# Patient Record
Sex: Female | Born: 1965 | Race: White | Hispanic: No | Marital: Married | Smoking: Never smoker
Health system: Southern US, Community
[De-identification: ages and names within clinical notes are randomized; demographics above are authoritative.]

## PROBLEM LIST (undated history)

## (undated) DIAGNOSIS — T4145XA Adverse effect of unspecified anesthetic, initial encounter: Secondary | ICD-10-CM

## (undated) DIAGNOSIS — T8859XA Other complications of anesthesia, initial encounter: Secondary | ICD-10-CM

## (undated) DIAGNOSIS — F419 Anxiety disorder, unspecified: Secondary | ICD-10-CM

## (undated) HISTORY — PX: LAPAROSCOPIC OVARIAN CYSTECTOMY: SUR786

## (undated) HISTORY — DX: Adverse effect of unspecified anesthetic, initial encounter: T41.45XA

## (undated) HISTORY — DX: Anxiety disorder, unspecified: F41.9

## (undated) HISTORY — PX: URETHRA SURGERY: SHX824

## (undated) HISTORY — DX: Other complications of anesthesia, initial encounter: T88.59XA

---

## 2000-06-04 ENCOUNTER — Inpatient Hospital Stay (HOSPITAL_COMMUNITY): Admission: AD | Admit: 2000-06-04 | Discharge: 2000-06-05 | Payer: Self-pay | Admitting: Obstetrics and Gynecology

## 2000-08-08 ENCOUNTER — Inpatient Hospital Stay (HOSPITAL_COMMUNITY): Admission: AD | Admit: 2000-08-08 | Discharge: 2000-08-08 | Payer: Self-pay | Admitting: Obstetrics & Gynecology

## 2000-08-11 ENCOUNTER — Inpatient Hospital Stay (HOSPITAL_COMMUNITY): Admission: AD | Admit: 2000-08-11 | Discharge: 2000-08-13 | Payer: Self-pay | Admitting: Obstetrics and Gynecology

## 2000-10-12 ENCOUNTER — Emergency Department (HOSPITAL_COMMUNITY): Admission: EM | Admit: 2000-10-12 | Discharge: 2000-10-12 | Payer: Self-pay | Admitting: Emergency Medicine

## 2003-03-18 ENCOUNTER — Other Ambulatory Visit: Admission: RE | Admit: 2003-03-18 | Discharge: 2003-03-18 | Payer: Self-pay | Admitting: Obstetrics and Gynecology

## 2008-07-11 ENCOUNTER — Emergency Department (HOSPITAL_COMMUNITY): Admission: EM | Admit: 2008-07-11 | Discharge: 2008-07-11 | Payer: Self-pay | Admitting: Emergency Medicine

## 2010-11-18 NOTE — H&P (Signed)
[place]  Patient:    Darlene Webb, Darlene Webb                     MRN: [id number] Adm. Date:  [id number] Attending:  Lenoard Aden CC:         [facility]   History and Physical  CHIEF COMPLAINT:  Advanced dilatation at 39 weeks for induction.  HISTORY OF PRESENT ILLNESS:  The patient is a 45 year old white female G2 P70, [facility] August 19, 2000, at 39 weeks who presents with advanced dilatation at 4+ cm, for induction.  The patient is GBS positive requiring intrapartum prophylaxis.  PAST MEDICAL HISTORY: 1. Remarkable for an uncomplicated vaginal delivery 7 pound 14 ounce female in    1998 with an epidural. 2. History of a right ovarian cyst removal in 1995.  No other medical or surgical hospitalizations.  FAMILY HISTORY:  Noncontributory.  ALLERGIES:  No known drug allergies.  MEDICATIONS:  Prenatal vitamins.  LABORATORY DATA:  Includes a blood type of A positive, Rh antibody negative, VDRL nonreactive with ______.  Hepatitis B surface antigen negative.  HIV nonreactive.  PHYSICAL EXAMINATION:  GENERAL:  The patient is well-developed, well-nourished white female in no acute distress.  HEENT:  Normal.  LUNGS:  Clear.  HEART:  Regular rhythm.  ABDOMEN:  Soft, gravid, nontender.  ESTIMATED FETAL WEIGHT:  7-1/2 pounds.  CERVICAL EXAM:  Reveals the cervix to be 4 cm dilated and about 70% effaced, soft, vertex and 0 station.  EXTREMITIES:  Revealed no ______.  NEUROLOGIC:  Nonfocal.  IMPRESSION: 1. A 39 week intrauterine pregnancy. 2. Advanced dilatation. 3. GBS positive.  PLAN:  Proceed with artificial rupture of membranes, intrapartum prophylaxis, Pitocin augmentation as necessary.  Anticipate attempts at vaginal delivery. DD:  08/11/00 TD:  08/11/00 Job: [id number]

## 2011-08-23 ENCOUNTER — Other Ambulatory Visit: Payer: Self-pay | Admitting: Obstetrics and Gynecology

## 2011-08-23 DIAGNOSIS — Z1231 Encounter for screening mammogram for malignant neoplasm of breast: Secondary | ICD-10-CM

## 2011-09-06 ENCOUNTER — Ambulatory Visit
Admission: RE | Admit: 2011-09-06 | Discharge: 2011-09-06 | Disposition: A | Discharge status: Home / Self Care | Payer: BC Managed Care – PPO | Source: Ambulatory Visit | Attending: Obstetrics and Gynecology | Admitting: Obstetrics and Gynecology

## 2011-09-06 DIAGNOSIS — Z1231 Encounter for screening mammogram for malignant neoplasm of breast: Secondary | ICD-10-CM

## 2015-10-20 ENCOUNTER — Encounter: Payer: Self-pay | Admitting: Gastroenterology

## 2015-12-07 ENCOUNTER — Ambulatory Visit (AMBULATORY_SURGERY_CENTER): Payer: BLUE CROSS/BLUE SHIELD

## 2015-12-07 VITALS — Ht 63.0 in | Wt 141.6 lb

## 2015-12-07 DIAGNOSIS — Z800 Family history of malignant neoplasm of digestive organs: Secondary | ICD-10-CM

## 2015-12-07 DIAGNOSIS — Z8371 Family history of colonic polyps: Secondary | ICD-10-CM

## 2015-12-07 MED ORDER — NA SULFATE-K SULFATE-MG SULF 17.5-3.13-1.6 GM/177ML PO SOLN: ORAL | Status: DC

## 2015-12-07 NOTE — Progress Notes (Signed)
Per pt, no allergies to soy or egg products.Pt not taking any weight loss meds or using  O2 at home. 

## 2015-12-09 ENCOUNTER — Encounter: Payer: Self-pay | Admitting: Gastroenterology

## 2015-12-21 ENCOUNTER — Encounter: Payer: Self-pay | Admitting: Gastroenterology

## 2015-12-21 ENCOUNTER — Ambulatory Visit (AMBULATORY_SURGERY_CENTER): Payer: BLUE CROSS/BLUE SHIELD | Admitting: Gastroenterology

## 2015-12-21 VITALS — BP 130/92 | HR 69 | Temp 98.0°F | Resp 19 | Ht 63.0 in | Wt 141.0 lb

## 2015-12-21 DIAGNOSIS — Z1211 Encounter for screening for malignant neoplasm of colon: Secondary | ICD-10-CM | POA: Diagnosis present

## 2015-12-21 DIAGNOSIS — D123 Benign neoplasm of transverse colon: Secondary | ICD-10-CM | POA: Diagnosis not present

## 2015-12-21 DIAGNOSIS — Z8371 Family history of colonic polyps: Secondary | ICD-10-CM

## 2015-12-21 DIAGNOSIS — D120 Benign neoplasm of cecum: Secondary | ICD-10-CM

## 2015-12-21 DIAGNOSIS — Z83719 Family history of colon polyps, unspecified: Secondary | ICD-10-CM

## 2015-12-21 MED ORDER — SODIUM CHLORIDE 0.9 % IV SOLN: 500.0000 mL | INTRAVENOUS | Status: DC

## 2015-12-21 NOTE — Op Note (Addendum)
[place] Patient Name: Darlene Webb Procedure Date: 12/21/2015 8:57 AM MRN: [id number] Endoscopist: Remo Lipps P. Havery Moros , MD Age: 50 Referring MD:  Date of Birth: 1965-09-24 Gender: Female Account #: [id number] Procedure:                Colonoscopy Indications:              Screening for malignant neoplasm in the colon, This                            is the patient's first colonoscopy Medicines:                Monitored Anesthesia Care Procedure:                Pre-Anesthesia Assessment:                           - Prior to the procedure, a History and Physical                            was performed, and patient medications and                            allergies were reviewed. The patient's tolerance of                            previous anesthesia was also reviewed. The risks                            and benefits of the procedure and the sedation                            options and risks were discussed with the patient.                            All questions were answered, and informed consent                            was obtained. Prior Anticoagulants: The patient has                            taken no previous anticoagulant or antiplatelet                            agents. ASA Grade Assessment: II - A patient with                            mild systemic disease. After reviewing the risks                            and benefits, the patient was deemed in                            satisfactory condition to undergo the procedure.  After obtaining informed consent, the colonoscope                            was passed under direct vision. Throughout the                            procedure, the patient's blood pressure, pulse, and                            oxygen saturations were monitored continuously. The                            Model PCF-H190L [id number]) scope was introduced                            through the anus  and advanced to the the cecum,                            identified by appendiceal orifice and ileocecal                            valve. The colonoscopy was performed without                            difficulty. The patient tolerated the procedure                            well. The quality of the bowel preparation was                            good. The ileocecal valve, appendiceal orifice, and                            rectum were photographed. Scope In: 9:11:22 AM Scope Out: 9:31:13 AM Scope Withdrawal Time: 0 hours 16 minutes 44 seconds  Total Procedure Duration: 0 hours 19 minutes 51 seconds  Findings:                 The perianal and digital rectal examinations were                            normal.                           A 3 mm polyp was found in the cecum. The polyp was                            sessile. The polyp was removed with a cold biopsy                            forceps. Resection and retrieval were complete.                           A 5 mm polyp was found in the cecum. The polyp was  sessile. The polyp was removed with a cold snare.                            Resection and retrieval were complete.                           A 3 mm polyp was found in the transverse colon. The                            polyp was sessile. The polyp was removed with a                            cold biopsy forceps. Resection and retrieval were                            complete.                           Non-bleeding internal hemorrhoids were found during                            retroflexion. The hemorrhoids were small.                           The exam was otherwise without abnormality. Complications:            No immediate complications. Estimated blood loss:                            Minimal. Estimated Blood Loss:     Estimated blood loss was minimal. Impression:               - One 3 mm polyp in the cecum, removed with a cold                             biopsy forceps. Resected and retrieved.                           - One 5 mm polyp in the cecum, removed with a cold                            snare. Resected and retrieved.                           - One 3 mm polyp in the transverse colon, removed                            with a cold biopsy forceps. Resected and retrieved.                           - Non-bleeding internal hemorrhoids.                           - The examination was otherwise normal. Recommendation:           -  Patient has a contact number available for                            emergencies. The signs and symptoms of potential                            delayed complications were discussed with the                            patient. Return to normal activities tomorrow.                            Written discharge instructions were provided to the                            patient.                           - Resume previous diet.                           - Continue present medications.                           - Await pathology results.                           - Repeat colonoscopy is recommended for                            surveillance. The colonoscopy date will be                            determined after pathology results from today's                            exam become available for review. Remo Lipps P. Guenther Dunshee, MD 12/21/2015 9:35:06 AM This report has been signed electronically.

## 2015-12-21 NOTE — Progress Notes (Signed)
No problems noted in the recovery room. maw 

## 2015-12-21 NOTE — Progress Notes (Signed)
Patient awakening,vss,report to rn 

## 2015-12-21 NOTE — Progress Notes (Signed)
Called to room to assist during endoscopic procedure.  Patient ID and intended procedure confirmed with present staff. Received instructions for my participation in the procedure from the performing physician.  

## 2015-12-21 NOTE — Patient Instructions (Signed)
YOU HAD AN ENDOSCOPIC PROCEDURE TODAY AT THE [facility]:   Refer to the procedure report that was given to you for any specific questions about what was found during the examination.  If the procedure report does not answer your questions, please call your gastroenterologist to clarify.  If you requested that your care partner not be given the details of your procedure findings, then the procedure report has been included in a sealed envelope for you to review at your convenience later.  YOU SHOULD EXPECT: Some feelings of bloating in the abdomen. Passage of more gas than usual.  Walking can help get rid of the air that was put into your GI tract during the procedure and reduce the bloating. If you had a lower endoscopy (such as a colonoscopy or flexible sigmoidoscopy) you may notice spotting of blood in your stool or on the toilet paper. If you underwent a bowel prep for your procedure, you may not have a normal bowel movement for a few days.  Please Note:  You might notice some irritation and congestion in your nose or some drainage.  This is from the oxygen used during your procedure.  There is no need for concern and it should clear up in a day or so.  SYMPTOMS TO REPORT IMMEDIATELY:   Following lower endoscopy (colonoscopy or flexible sigmoidoscopy):  Excessive amounts of blood in the stool  Significant tenderness or worsening of abdominal pains  Swelling of the abdomen that is new, acute  Fever of 100F or higher   For urgent or emergent issues, a gastroenterologist can be reached at any hour by calling [phone].   DIET: Your first meal following the procedure should be a small meal and then it is ok to progress to your normal diet. Heavy or fried foods are harder to digest and may make you feel nauseous or bloated.  Likewise, meals heavy in dairy and vegetables can increase bloating.  Drink plenty of fluids but you should avoid alcoholic beverages for 24  hours.  ACTIVITY:  You should plan to take it easy for the rest of today and you should NOT DRIVE or use heavy machinery until tomorrow (because of the sedation medicines used during the test).    FOLLOW UP: Our staff will call the number listed on your records the next business day following your procedure to check on you and address any questions or concerns that you may have regarding the information given to you following your procedure. If we do not reach you, we will leave a message.  However, if you are feeling well and you are not experiencing any problems, there is no need to return our call.  We will assume that you have returned to your regular daily activities without incident.  If any biopsies were taken you will be contacted by phone or by letter within the next 1-3 weeks.  Please call us at [phone] if you have not heard about the biopsies in 3 weeks.    SIGNATURES/CONFIDENTIALITY: You and/or your care partner have signed paperwork which will be entered into your electronic medical record.  These signatures attest to the fact that that the information above on your After Visit Summary has been reviewed and is understood.  Full responsibility of the confidentiality of this discharge information lies with you and/or your care-partner.   Handouts were given to your care partner on polyps and hemorrhoids. You may resume your current medications today. Await biopsy results. Please call   any questions or concerns.   

## 2015-12-22 ENCOUNTER — Telehealth: Payer: Self-pay

## 2015-12-22 NOTE — Telephone Encounter (Signed)
  Follow up Call-  Call back number 12/21/2015  Post procedure Call Back phone  # [phone]  Permission to leave phone message Yes     Patient was called for follow up after her procedure on 12/21/2015. No answer at the number given for follow up phone call. A message was left on the answering machine.

## 2015-12-23 ENCOUNTER — Encounter: Payer: Self-pay | Admitting: Gastroenterology

## 2016-01-31 ENCOUNTER — Telehealth: Payer: Self-pay | Admitting: Gastroenterology

## 2016-01-31 NOTE — Telephone Encounter (Signed)
Patient states she noticed a raised lump on labia several days after procedure. Wants to know if anything is mention in her colonoscopy report about this. No note in procedure report. Patient aware and she is going to call her GYN to evaluated issue.

## 2018-12-19 ENCOUNTER — Encounter: Payer: Self-pay | Admitting: Gastroenterology

## 2019-08-28 ENCOUNTER — Other Ambulatory Visit: Payer: Self-pay

## 2019-08-28 ENCOUNTER — Ambulatory Visit (AMBULATORY_SURGERY_CENTER): Payer: Self-pay | Admitting: *Deleted

## 2019-08-28 VITALS — Temp 98.0°F | Ht 63.0 in | Wt 122.2 lb

## 2019-08-28 DIAGNOSIS — Z01818 Encounter for other preprocedural examination: Secondary | ICD-10-CM

## 2019-08-28 DIAGNOSIS — Z86010 Personal history of colonic polyps: Secondary | ICD-10-CM

## 2019-08-28 MED ORDER — NA SULFATE-K SULFATE-MG SULF 17.5-3.13-1.6 GM/177ML PO SOLN: 1.0000 | Freq: Once | ORAL | 0 refills | Status: AC

## 2019-08-28 NOTE — Progress Notes (Signed)

## 2019-09-02 ENCOUNTER — Telehealth: Payer: Self-pay | Admitting: Gastroenterology

## 2019-09-02 DIAGNOSIS — Z86010 Personal history of colonic polyps: Secondary | ICD-10-CM

## 2019-09-02 NOTE — Telephone Encounter (Signed)
Patient called inquiring about her prep medication would like it sent to [facility] in [place]

## 2019-09-03 MED ORDER — NA SULFATE-K SULFATE-MG SULF 17.5-3.13-1.6 GM/177ML PO SOLN: 1.0000 | Freq: Once | ORAL | 0 refills | Status: AC

## 2019-09-03 NOTE — Telephone Encounter (Signed)
Suprep sent to [facility] on [address]. EW

## 2019-09-05 MED ORDER — SUPREP BOWEL PREP KIT 17.5-3.13-1.6 GM/177ML PO SOLN: 1.0000 | Freq: Once | ORAL | 0 refills | Status: AC

## 2019-09-05 NOTE — Addendum Note (Signed)
Addended by: Steva Ready on: 09/05/2019 11:36 AM   Modules accepted: Orders

## 2019-09-05 NOTE — Telephone Encounter (Signed)
Sent in script for Suprep to [facility] - the drug is now showing in med list- pt made aware of prep sent

## 2019-09-05 NOTE — Telephone Encounter (Signed)
Pt stated that [facility] on [address] did not receive prescription for Suprep.

## 2019-09-11 ENCOUNTER — Other Ambulatory Visit: Payer: Self-pay | Admitting: Gastroenterology

## 2019-09-11 ENCOUNTER — Ambulatory Visit (INDEPENDENT_AMBULATORY_CARE_PROVIDER_SITE_OTHER): Payer: Self-pay

## 2019-09-11 DIAGNOSIS — Z86010 Personal history of colonic polyps: Secondary | ICD-10-CM

## 2019-09-11 LAB — SARS CORONAVIRUS 2 (TAT 6-24 HRS): SARS Coronavirus 2: NEGATIVE

## 2019-09-16 ENCOUNTER — Ambulatory Visit (AMBULATORY_SURGERY_CENTER): Payer: No Typology Code available for payment source | Admitting: Gastroenterology

## 2019-09-16 ENCOUNTER — Encounter: Payer: Self-pay | Admitting: Gastroenterology

## 2019-09-16 ENCOUNTER — Other Ambulatory Visit: Payer: Self-pay

## 2019-09-16 VITALS — BP 134/66 | HR 71 | Temp 96.9°F | Resp 16 | Ht 63.0 in | Wt 122.0 lb

## 2019-09-16 DIAGNOSIS — D125 Benign neoplasm of sigmoid colon: Secondary | ICD-10-CM | POA: Diagnosis not present

## 2019-09-16 DIAGNOSIS — D122 Benign neoplasm of ascending colon: Secondary | ICD-10-CM

## 2019-09-16 DIAGNOSIS — D120 Benign neoplasm of cecum: Secondary | ICD-10-CM | POA: Diagnosis not present

## 2019-09-16 DIAGNOSIS — D123 Benign neoplasm of transverse colon: Secondary | ICD-10-CM

## 2019-09-16 DIAGNOSIS — Z86010 Personal history of colonic polyps: Secondary | ICD-10-CM | POA: Diagnosis not present

## 2019-09-16 MED ORDER — SODIUM CHLORIDE 0.9 % IV SOLN: 500.0000 mL | Freq: Once | INTRAVENOUS | Status: DC

## 2019-09-16 NOTE — Progress Notes (Signed)
Pt's states no medical or surgical changes since previsit or office visit.  [place]

## 2019-09-16 NOTE — Progress Notes (Signed)
pt tolerated well. VSS. awake and to recovery. Report given to RN.  

## 2019-09-16 NOTE — Progress Notes (Signed)
Called to room to assist during endoscopic procedure.  Patient ID and intended procedure confirmed with present staff. Received instructions for my participation in the procedure from the performing physician.  

## 2019-09-16 NOTE — Op Note (Signed)
[place] Patient Name: Darlene Webb Procedure Date: 09/16/2019 1:28 PM MRN: [id number] Endoscopist: Remo Lipps P. Havery Moros , MD Age: 54 Referring MD:  Date of Birth: [birth year] Gender: Female Account #: [id number] Procedure:                Colonoscopy Indications:              High risk colon cancer surveillance: Personal                            history of colonic polyps (3 adenomas / sessile                            serrated polyps 2017) Medicines:                Monitored Anesthesia Care Procedure:                Pre-Anesthesia Assessment:                           - Prior to the procedure, a History and Physical                            was performed, and patient medications and                            allergies were reviewed. The patient's tolerance of                            previous anesthesia was also reviewed. The risks                            and benefits of the procedure and the sedation                            options and risks were discussed with the patient.                            All questions were answered, and informed consent                            was obtained. Prior Anticoagulants: The patient has                            taken no previous anticoagulant or antiplatelet                            agents. ASA Grade Assessment: I - A normal, healthy                            patient. After reviewing the risks and benefits,                            the patient was deemed in satisfactory condition to  undergo the procedure.                           After obtaining informed consent, the colonoscope                            was passed under direct vision. Throughout the                            procedure, the patient's blood pressure, pulse, and                            oxygen saturations were monitored continuously. The                            Colonoscope was introduced through the anus and                             advanced to the the cecum, identified by                            appendiceal orifice and ileocecal valve. The                            colonoscopy was performed without difficulty. The                            patient tolerated the procedure well. The quality                            of the bowel preparation was good. The ileocecal                            valve, appendiceal orifice, and rectum were                            photographed. Scope In: 1:32:09 PM Scope Out: 1:57:27 PM Scope Withdrawal Time: 0 hours 20 minutes 37 seconds  Total Procedure Duration: 0 hours 25 minutes 18 seconds  Findings:                 The perianal and digital rectal examinations were                            normal.                           A diminutive polyp was found in the cecum. The                            polyp was sessile. The polyp was removed with a                            cold snare. Resection and retrieval were complete.  Two sessile polyps were found in the ascending                            colon. The polyps were 3 mm in size. These polyps                            were removed with a cold snare. Resection and                            retrieval were complete.                           Two sessile polyps were found in the transverse                            colon. The polyps were 3 to 4 mm in size. These                            polyps were removed with a cold snare. Resection                            and retrieval were complete.                           A 3 mm polyp was found in the sigmoid colon. The                            polyp was sessile. The polyp was removed with a                            cold snare. Resection and retrieval were complete.                           The exam was otherwise without abnormality. Complications:            No immediate complications. Estimated blood loss:                             Minimal. Estimated Blood Loss:     Estimated blood loss was minimal. Impression:               - One diminutive polyp in the cecum, removed with a                            cold snare. Resected and retrieved.                           - Two 3 mm polyps in the ascending colon, removed                            with a cold snare. Resected and retrieved.                           -  Two 3 to 4 mm polyps in the transverse colon,                            removed with a cold snare. Resected and retrieved.                           - One 3 mm polyp in the sigmoid colon, removed with                            a cold snare. Resected and retrieved.                           - The examination was otherwise normal. Recommendation:           - Patient has a contact number available for                            emergencies. The signs and symptoms of potential                            delayed complications were discussed with the                            patient. Return to normal activities tomorrow.                            Written discharge instructions were provided to the                            patient.                           - Resume previous diet.                           - Continue present medications.                           - Await pathology results. Remo Lipps P. Kashish Yglesias, MD 09/16/2019 2:03:33 PM This report has been signed electronically.

## 2019-09-16 NOTE — Patient Instructions (Signed)
Hand outs given: Polyps X6 Resume previous diet Continue current medications Await pathology results   YOU HAD AN ENDOSCOPIC PROCEDURE TODAY AT [place]:   Refer to the procedure report that was given to you for any specific questions about what was found during the examination.  If the procedure report does not answer your questions, please call your gastroenterologist to clarify.  If you requested that your care partner not be given the details of your procedure findings, then the procedure report has been included in a sealed envelope for you to review at your convenience later.  YOU SHOULD EXPECT: Some feelings of bloating in the abdomen. Passage of more gas than usual.  Walking can help get rid of the air that was put into your GI tract during the procedure and reduce the bloating. If you had a lower endoscopy (such as a colonoscopy or flexible sigmoidoscopy) you may notice spotting of blood in your stool or on the toilet paper. If you underwent a bowel prep for your procedure, you may not have a normal bowel movement for a few days.  Please Note:  You might notice some irritation and congestion in your nose or some drainage.  This is from the oxygen used during your procedure.  There is no need for concern and it should clear up in a day or so.  SYMPTOMS TO REPORT IMMEDIATELY:   Following lower endoscopy (colonoscopy or flexible sigmoidoscopy):  Excessive amounts of blood in the stool  Significant tenderness or worsening of abdominal pains  Swelling of the abdomen that is new, acute  Fever of 100F or higher   For urgent or emergent issues, a gastroenterologist can be reached at any hour by calling [phone]. Do not use MyChart messaging for urgent concerns.    DIET:  We do recommend a small meal at first, but then you may proceed to your regular diet.  Drink plenty of fluids but you should avoid alcoholic beverages for 24 hours.  ACTIVITY:  You should plan  to take it easy for the rest of today and you should NOT DRIVE or use heavy machinery until tomorrow (because of the sedation medicines used during the test).    FOLLOW UP: Our staff will call the number listed on your records 48-72 hours following your procedure to check on you and address any questions or concerns that you may have regarding the information given to you following your procedure. If we do not reach you, we will leave a message.  We will attempt to reach you two times.  During this call, we will ask if you have developed any symptoms of COVID 19. If you develop any symptoms (ie: fever, flu-like symptoms, shortness of breath, cough etc.) before then, please call [phone].  If you test positive for Covid 19 in the 2 weeks post procedure, please call and report this information to [place].    If any biopsies were taken you will be contacted by phone or by letter within the next 1-3 weeks.  Please call us at [phone] if you have not heard about the biopsies in 3 weeks.    SIGNATURES/CONFIDENTIALITY: You and/or your care partner have signed paperwork which will be entered into your electronic medical record.  These signatures attest to the fact that that the information above on your After Visit Summary has been reviewed and is understood.  Full responsibility of the confidentiality of this discharge information lies with you and/or your care-partner.

## 2019-09-18 ENCOUNTER — Telehealth: Payer: Self-pay

## 2019-09-18 ENCOUNTER — Telehealth: Payer: Self-pay | Admitting: *Deleted

## 2019-09-18 NOTE — Telephone Encounter (Signed)
  Follow up Call-  Call back number 09/16/2019  Post procedure Call Back phone  # [phone]  Permission to leave phone message Yes  Some recent data might be hidden     Patient questions:  Do you have a fever, pain , or abdominal swelling? No. Pain Score  0 *  Have you tolerated food without any problems? Yes.    Have you been able to return to your normal activities? Yes.    Do you have any questions about your discharge instructions: Diet   No. Medications  No. Follow up visit  No.  Do you have questions or concerns about your Care? No.  Actions: * If pain score is 4 or above: No action needed, pain <4.  1. Have you developed a fever since your procedure? no  2.   Have you had an respiratory symptoms (SOB or cough) since your procedure? no  3.   Have you tested positive for COVID 19 since your procedure no  4.   Have you had any family members/close contacts diagnosed with the COVID 19 since your procedure?  no   If yes to any of these questions please route to Joylene John, RN and Alphonsa Gin, Therapist, sports.

## 2019-09-18 NOTE — Telephone Encounter (Signed)
  Follow up Call-  Call back number 09/16/2019  Post procedure Call Back phone  # [phone]  Permission to leave phone message Yes  Some recent data might be hidden     Patient questions:  Message left to all [place] if necessary.

## 2019-09-22 ENCOUNTER — Encounter: Payer: Self-pay | Admitting: Gastroenterology

## 2019-10-06 ENCOUNTER — Telehealth: Payer: Self-pay | Admitting: Gastroenterology

## 2019-10-06 NOTE — Telephone Encounter (Signed)
Thanks [employer]. She had a few polyps removed during recent colonoscopy, only 2 very small polyps were considered to be adenomas, the rest were benign without pre-cancerous changes. Her grandfather's history should not influence how frequently she gets screening at this point, only a first degree colon cancer history at an age < 8 would warrant screenings sooner (every 5 years).  Please reassure her that her risk for colon cancer is low based on her recent exam and current guidelines recommend a 7 year follow up. If she is anxious about this could consider it to be done in 5 years for piece of mind, up to her. Thanks

## 2019-10-06 NOTE — Telephone Encounter (Signed)
Patient questions the interval of her colonoscopy to 7 years.  She has a family hx of colon cancer in a grandfather dx at age 55.  Give the number or polyps she feels that it should be sooner than 7 years.  She did not want to schedule an office visit to discuss. She is aware you are the hospital MD and may not address until next week.

## 2019-10-07 ENCOUNTER — Other Ambulatory Visit: Payer: Self-pay | Admitting: Otolaryngology

## 2019-10-07 DIAGNOSIS — J339 Nasal polyp, unspecified: Secondary | ICD-10-CM

## 2019-10-07 NOTE — Telephone Encounter (Signed)
Left message for patient to call back  

## 2019-10-08 NOTE — Telephone Encounter (Signed)
Patient notified of the response 

## 2019-10-10 MED ORDER — NA SULFATE-K SULFATE-MG SULF 17.5-3.13-1.6 GM/177ML PO SOLN
1.0000 | Freq: Once | ORAL | 0 refills | Status: AC
Start: 2019-10-10 — End: 2019-10-10

## 2019-10-16 ENCOUNTER — Other Ambulatory Visit: Payer: Self-pay

## 2019-10-17 ENCOUNTER — Encounter: Payer: Self-pay | Admitting: Family Medicine

## 2019-10-17 ENCOUNTER — Ambulatory Visit (INDEPENDENT_AMBULATORY_CARE_PROVIDER_SITE_OTHER): Payer: No Typology Code available for payment source | Admitting: Family Medicine

## 2019-10-17 ENCOUNTER — Ambulatory Visit
Admission: RE | Admit: 2019-10-17 | Discharge: 2019-10-17 | Disposition: A | Discharge status: Home / Self Care | Payer: No Typology Code available for payment source | Source: Ambulatory Visit | Attending: Otolaryngology | Admitting: Otolaryngology

## 2019-10-17 VITALS — BP 120/80 | HR 81 | Temp 98.4°F | Ht 63.0 in | Wt 127.2 lb

## 2019-10-17 DIAGNOSIS — J339 Nasal polyp, unspecified: Secondary | ICD-10-CM

## 2019-10-17 DIAGNOSIS — K635 Polyp of colon: Secondary | ICD-10-CM | POA: Insufficient documentation

## 2019-10-17 DIAGNOSIS — R5383 Other fatigue: Secondary | ICD-10-CM

## 2019-10-17 DIAGNOSIS — Z1231 Encounter for screening mammogram for malignant neoplasm of breast: Secondary | ICD-10-CM

## 2019-10-17 DIAGNOSIS — Z131 Encounter for screening for diabetes mellitus: Secondary | ICD-10-CM | POA: Diagnosis not present

## 2019-10-17 DIAGNOSIS — D126 Benign neoplasm of colon, unspecified: Secondary | ICD-10-CM | POA: Diagnosis not present

## 2019-10-17 DIAGNOSIS — Z13220 Encounter for screening for lipoid disorders: Secondary | ICD-10-CM

## 2019-10-17 NOTE — Progress Notes (Signed)
Darlene Webb DOB: 1965/10/15 Encounter date: 10/17/2019  This isa 54 y.o. female who presents to establish care. Chief Complaint  Patient presents with  . Establish Care    History of present illness: No longer seeing Dr. Ronita Webb.   No specific worries today.   Took sertraline a few years ago when husband was working a lot.   Had colonoscopy first 3-4 years ago. Repeated last month and had polyps and was told 7 years.   Exercises at least three times/week for hour walk. More when weather good. Pretty healthy eater overall.   Doesn't follow with dermatology regularly.  Had CT this morning through ENT. Considering surgery for nasal polyp.   Past Medical History:  Diagnosis Date  . Anesthesia complication    sensitive to sedation per pt!   Past Surgical History:  Procedure Laterality Date  . LAPAROSCOPIC OVARIAN CYSTECTOMY    . URETHRA SURGERY     stretched as an infant   No Known Allergies No outpatient medications have been marked as taking for the 10/17/19 encounter (Office Visit) with Caren Macadam, MD.   Social History   Tobacco Use  . Smoking status: Never Smoker  . Smokeless tobacco: Never Used  Substance Use Topics  . Alcohol use: Yes    Alcohol/week: 7.0 standard drinks    Types: 7 Cans of beer per week   Family History  Problem Relation Age of Onset  . Colon polyps Mother   . Parkinson's disease Father 46  . Healthy Sister   . Colon cancer Maternal Grandfather   . Colon cancer Maternal Great-grandfather   . Lung cancer Maternal Grandmother        smoker  . Multiple myeloma Paternal Grandfather   . Alopecia Son   . Eczema Daughter   . Esophageal cancer Neg Hx   . Rectal cancer Neg Hx   . Stomach cancer Neg Hx      Review of Systems  Constitutional: Positive for fatigue. Negative for chills and fever.  Respiratory: Negative for cough, chest tightness, shortness of breath and wheezing.   Cardiovascular: Negative for chest pain,  palpitations and leg swelling.    Objective:  BP 120/80 (BP Location: Left Arm, Patient Position: Sitting, Cuff Size: Normal)   Pulse 81   Temp 98.4 F (36.9 C) (Temporal)   Ht 5' 3"  (1.6 m)   Wt 127 lb 3.2 oz (57.7 kg)   LMP 11/19/2015   BMI 22.53 kg/m   Weight: 127 lb 3.2 oz (57.7 kg)   BP Readings from Last 3 Encounters:  10/17/19 120/80  09/16/19 134/66  12/21/15 (!) 130/92   Wt Readings from Last 3 Encounters:  10/17/19 127 lb 3.2 oz (57.7 kg)  09/16/19 122 lb (55.3 kg)  08/28/19 122 lb 3.2 oz (55.4 kg)    Physical Exam Constitutional:      General: She is not in acute distress.    Appearance: She is well-developed.  Cardiovascular:     Rate and Rhythm: Normal rate and regular rhythm.     Heart sounds: Normal heart sounds. No murmur. No friction rub.  Pulmonary:     Effort: Pulmonary effort is normal. No respiratory distress.     Breath sounds: Normal breath sounds. No wheezing or rales.  Musculoskeletal:     Right lower leg: No edema.     Left lower leg: No edema.  Neurological:     Mental Status: She is alert and oriented to person, place, and time.  Psychiatric:  Behavior: Behavior normal.     Assessment/Plan:  1. Adenomatous polyp of colon, unspecified part of colon We discussed reasons for suggested GI follow-up, but patient would feel more comfortable with 5-year follow-up.  We discussed that guidelines can also change over time, so we will continue to discuss and as time approaches for 5-year mark we can review what return screening options are recommended at that time.  2. Fatigue, unspecified type - VITAMIN D 25 Hydroxy (Vit-D Deficiency, Fractures); Future - Vitamin B12; Future - TSH; Future - Comprehensive metabolic panel; Future - CBC with Differential/Platelet; Future - CBC with Differential/Platelet - Comprehensive metabolic panel - TSH - Vitamin B12 - VITAMIN D 25 Hydroxy (Vit-D Deficiency, Fractures)  3. Lipid screening -  Lipid panel; Future - Lipid panel  4. Screening for diabetes mellitus  5. Encounter for screening mammogram for malignant neoplasm of breast - MM DIGITAL SCREENING BILATERAL; Future  Return in about 3 months (around 01/16/2020) for physical exam.  [name], MD

## 2019-10-18 LAB — COMPREHENSIVE METABOLIC PANEL
AG Ratio: 1.6 (calc) (ref 1.0–2.5)
ALT: 15 U/L (ref 6–29)
AST: 14 U/L (ref 10–35)
Albumin: 4.3 g/dL (ref 3.6–5.1)
Alkaline phosphatase (APISO): 68 U/L (ref 37–153)
BUN: 11 mg/dL (ref 7–25)
CO2: 27 mmol/L (ref 20–32)
Calcium: 9.3 mg/dL (ref 8.6–10.4)
Chloride: 106 mmol/L (ref 98–110)
Creat: 0.71 mg/dL (ref 0.50–1.05)
Globulin: 2.7 g/dL (calc) (ref 1.9–3.7)
Glucose, Bld: 95 mg/dL (ref 65–99)
Potassium: 4.3 mmol/L (ref 3.5–5.3)
Sodium: 141 mmol/L (ref 135–146)
Total Bilirubin: 0.7 mg/dL (ref 0.2–1.2)
Total Protein: 7 g/dL (ref 6.1–8.1)

## 2019-10-18 LAB — CBC WITH DIFFERENTIAL/PLATELET
Absolute Monocytes: 360 cells/uL (ref 200–950)
Basophils Absolute: 50 cells/uL (ref 0–200)
Basophils Relative: 0.7 %
Eosinophils Absolute: 353 cells/uL (ref 15–500)
Eosinophils Relative: 4.9 %
HCT: 35.8 % (ref 35.0–45.0)
Hemoglobin: 11.7 g/dL (ref 11.7–15.5)
Lymphs Abs: 2945 cells/uL (ref 850–3900)
MCH: 30 pg (ref 27.0–33.0)
MCHC: 32.7 g/dL (ref 32.0–36.0)
MCV: 91.8 fL (ref 80.0–100.0)
MPV: 8.9 fL (ref 7.5–12.5)
Monocytes Relative: 5 %
Neutro Abs: 3492 cells/uL (ref 1500–7800)
Neutrophils Relative %: 48.5 %
Platelets: 456 10*3/uL — ABNORMAL HIGH (ref 140–400)
RBC: 3.9 10*6/uL (ref 3.80–5.10)
RDW: 11.9 % (ref 11.0–15.0)
Total Lymphocyte: 40.9 %
WBC: 7.2 10*3/uL (ref 3.8–10.8)

## 2019-10-18 LAB — VITAMIN B12: Vitamin B-12: 280 pg/mL (ref 200–1100)

## 2019-10-18 LAB — VITAMIN D 25 HYDROXY (VIT D DEFICIENCY, FRACTURES): Vit D, 25-Hydroxy: 9 ng/mL — ABNORMAL LOW (ref 30–100)

## 2019-10-18 LAB — LIPID PANEL
Cholesterol: 215 mg/dL — ABNORMAL HIGH (ref <200)
HDL: 84 mg/dL (ref >=50)
LDL Cholesterol (Calc): 115 mg/dL (calc) — ABNORMAL HIGH
Non-HDL Cholesterol (Calc): 131 mg/dL (calc) — ABNORMAL HIGH (ref <130)
Total CHOL/HDL Ratio: 2.6 (calc) (ref <5.0)
Triglycerides: 67 mg/dL (ref <150)

## 2019-10-18 LAB — TSH: TSH: 1.16 mIU/L

## 2019-10-27 ENCOUNTER — Other Ambulatory Visit: Payer: Self-pay | Admitting: Otolaryngology

## 2019-10-30 MED ORDER — VITAMIN D (ERGOCALCIFEROL) 1.25 MG (50000 UNIT) PO CAPS
50000.0000 [IU] | ORAL_CAPSULE | ORAL | 0 refills | Status: DC
Start: 2019-10-30 — End: 2019-12-30

## 2019-10-30 NOTE — Addendum Note (Signed)
Addended by: Agnes Lawrence on: 10/30/2019 09:48 AM   Modules accepted: Orders

## 2019-11-18 ENCOUNTER — Encounter (HOSPITAL_BASED_OUTPATIENT_CLINIC_OR_DEPARTMENT_OTHER): Payer: Self-pay

## 2019-11-18 ENCOUNTER — Ambulatory Visit (HOSPITAL_BASED_OUTPATIENT_CLINIC_OR_DEPARTMENT_OTHER): Admit: 2019-11-18 | Payer: No Typology Code available for payment source | Admitting: Otolaryngology

## 2019-11-18 SURGERY — SURGERY, PARANASAL SINUS, ENDOSCOPIC, WITH NASAL SEPTOPLASTY, TURBINOPLASTY, AND MAXILLARY SINUSOTOMY
Anesthesia: General | Laterality: Bilateral

## 2019-11-21 ENCOUNTER — Ambulatory Visit
Admission: RE | Admit: 2019-11-21 | Discharge: 2019-11-21 | Disposition: A | Discharge status: Home / Self Care | Payer: PRIVATE HEALTH INSURANCE | Source: Ambulatory Visit | Attending: Family Medicine | Admitting: Family Medicine

## 2019-11-21 ENCOUNTER — Other Ambulatory Visit: Payer: Self-pay

## 2019-11-21 DIAGNOSIS — Z1231 Encounter for screening mammogram for malignant neoplasm of breast: Secondary | ICD-10-CM

## 2019-12-30 ENCOUNTER — Other Ambulatory Visit: Payer: Self-pay | Admitting: Family Medicine

## 2020-01-16 ENCOUNTER — Encounter: Payer: No Typology Code available for payment source | Admitting: Family Medicine

## 2020-03-01 ENCOUNTER — Other Ambulatory Visit: Payer: Self-pay

## 2020-03-01 ENCOUNTER — Ambulatory Visit (INDEPENDENT_AMBULATORY_CARE_PROVIDER_SITE_OTHER): Payer: PRIVATE HEALTH INSURANCE | Admitting: Family Medicine

## 2020-03-01 ENCOUNTER — Encounter: Payer: Self-pay | Admitting: Family Medicine

## 2020-03-01 ENCOUNTER — Other Ambulatory Visit (HOSPITAL_COMMUNITY)
Admission: RE | Admit: 2020-03-01 | Discharge: 2020-03-01 | Disposition: A | Discharge status: Home / Self Care | Payer: PRIVATE HEALTH INSURANCE | Source: Ambulatory Visit | Attending: Family Medicine | Admitting: Family Medicine

## 2020-03-01 VITALS — BP 138/80 | HR 82 | Temp 98.7°F | Ht 62.75 in | Wt 118.8 lb

## 2020-03-01 DIAGNOSIS — Z0000 Encounter for general adult medical examination without abnormal findings: Secondary | ICD-10-CM

## 2020-03-01 DIAGNOSIS — Z23 Encounter for immunization: Secondary | ICD-10-CM

## 2020-03-01 DIAGNOSIS — Z124 Encounter for screening for malignant neoplasm of cervix: Secondary | ICD-10-CM | POA: Diagnosis not present

## 2020-03-01 NOTE — Patient Instructions (Addendum)
(  insight timer is a free app - go to guided meditations on there and get some assistance with that deep breathing/calming prior to your meeting today!)  Check your blood pressure at home a few times in next week. Let me know if elevated.

## 2020-03-01 NOTE — Progress Notes (Signed)
Darlene Webb DOB: 05-09-1966 Encounter date: 03/01/2020  This is a 54 y.o. female who presents for complete physical   History of present illness/Additional concerns: Last visit was for/21 for establish care. Last colonoscopy March/2021.  Repeat 5 years based on family history.  Has afternoon meeting and she is stressed about this as she has a feeling she will be ambushed. Has been a zoom/COVID miscommunication year.   Past Medical History:  Diagnosis Date  . Anesthesia complication    sensitive to sedation per pt!   Past Surgical History:  Procedure Laterality Date  . LAPAROSCOPIC OVARIAN CYSTECTOMY    . URETHRA SURGERY     stretched as an infant   No Known Allergies Current Meds  Medication Sig  . Cyanocobalamin (VITAMIN B-12 PO) Take by mouth.  . Vitamin D, Ergocalciferol, (DRISDOL) 1.25 MG (50000 UNIT) CAPS capsule TAKE 1 CAPSULE (50,000 UNITS TOTAL) BY MOUTH EVERY 7 (SEVEN) DAYS.   Social History   Tobacco Use  . Smoking status: Never Smoker  . Smokeless tobacco: Never Used  Substance Use Topics  . Alcohol use: Yes    Alcohol/week: 7.0 standard drinks    Types: 7 Cans of beer per week   Family History  Problem Relation Age of Onset  . Colon polyps Mother   . Parkinson's disease Father 19  . Healthy Sister   . Colon cancer Maternal Grandfather   . Colon cancer Maternal Great-grandfather   . Lung cancer Maternal Grandmother        smoker  . Multiple myeloma Paternal Grandfather   . Alopecia Son   . Eczema Daughter   . Esophageal cancer Neg Hx   . Rectal cancer Neg Hx   . Stomach cancer Neg Hx      Review of Systems  Constitutional: Negative for activity change, appetite change, chills, fatigue, fever and unexpected weight change.  HENT: Negative for congestion, ear pain, hearing loss, sinus pressure, sinus pain, sore throat and trouble swallowing.   Eyes: Negative for pain and visual disturbance.  Respiratory: Negative for cough, chest tightness,  shortness of breath and wheezing.   Cardiovascular: Negative for chest pain, palpitations and leg swelling.  Gastrointestinal: Negative for abdominal pain, blood in stool, constipation, diarrhea, nausea and vomiting.  Genitourinary: Negative for difficulty urinating and menstrual problem.  Musculoskeletal: Negative for arthralgias and back pain.  Skin: Negative for rash.  Neurological: Negative for dizziness, weakness, numbness and headaches.  Hematological: Negative for adenopathy. Does not bruise/bleed easily.  Psychiatric/Behavioral: Negative for sleep disturbance and suicidal ideas. The patient is not nervous/anxious.     CBC:  Lab Results  Component Value Date   WBC 7.2 10/17/2019   HGB 11.7 10/17/2019   HCT 35.8 10/17/2019   MCH 30.0 10/17/2019   MCHC 32.7 10/17/2019   RDW 11.9 10/17/2019   PLT 456 (H) 10/17/2019   MPV 8.9 10/17/2019   CMP: Lab Results  Component Value Date   NA 141 10/17/2019   K 4.3 10/17/2019   CL 106 10/17/2019   CO2 27 10/17/2019   GLUCOSE 95 10/17/2019   BUN 11 10/17/2019   CREATININE 0.71 10/17/2019   CALCIUM 9.3 10/17/2019   PROT 7.0 10/17/2019   BILITOT 0.7 10/17/2019   ALT 15 10/17/2019   AST 14 10/17/2019   LIPID: Lab Results  Component Value Date   CHOL 215 (H) 10/17/2019   TRIG 67 10/17/2019   HDL 84 10/17/2019   LDLCALC 115 (H) 10/17/2019    Objective:  BP  138/80 (BP Location: Left Arm, Patient Position: Sitting, Cuff Size: Normal)   Pulse 82   Temp 98.7 F (37.1 C) (Oral)   Ht 5' 2.75" (1.594 m)   Wt 118 lb 12.8 oz (53.9 kg)   LMP 11/19/2015   BMI 21.21 kg/m   Weight: 118 lb 12.8 oz (53.9 kg)   BP Readings from Last 3 Encounters:  03/01/20 138/80  10/17/19 120/80  09/16/19 134/66   Wt Readings from Last 3 Encounters:  03/01/20 118 lb 12.8 oz (53.9 kg)  10/17/19 127 lb 3.2 oz (57.7 kg)  09/16/19 122 lb (55.3 kg)    Physical Exam Exam conducted with a chaperone present.  Constitutional:      General: She is  not in acute distress.    Appearance: She is well-developed.  HENT:     Head: Normocephalic and atraumatic.     Right Ear: External ear normal.     Left Ear: External ear normal.     Mouth/Throat:     Pharynx: No oropharyngeal exudate.  Eyes:     Conjunctiva/sclera: Conjunctivae normal.     Pupils: Pupils are equal, round, and reactive to light.  Neck:     Thyroid: No thyromegaly.  Cardiovascular:     Rate and Rhythm: Normal rate and regular rhythm.     Heart sounds: Normal heart sounds. No murmur heard.  No friction rub. No gallop.   Pulmonary:     Effort: Pulmonary effort is normal.     Breath sounds: Normal breath sounds.  Abdominal:     General: Bowel sounds are normal. There is no distension.     Palpations: Abdomen is soft. There is no mass.     Tenderness: There is no abdominal tenderness. There is no guarding.     Hernia: No hernia is present. There is no hernia in the left inguinal area or right inguinal area.  Genitourinary:    Exam position: Supine.     Vagina: Normal.     Cervix: Normal.     Uterus: Normal.      Adnexa: Right adnexa normal.  Musculoskeletal:        General: No tenderness or deformity. Normal range of motion.     Cervical back: Normal range of motion and neck supple.  Lymphadenopathy:     Cervical: No cervical adenopathy.     Lower Body: No right inguinal adenopathy. No left inguinal adenopathy.  Skin:    General: Skin is warm and dry.     Findings: No rash.  Neurological:     Mental Status: She is alert and oriented to person, place, and time.     Deep Tendon Reflexes: Reflexes normal.     Reflex Scores:      Tricep reflexes are 2+ on the right side and 2+ on the left side.      Bicep reflexes are 2+ on the right side and 2+ on the left side.      Brachioradialis reflexes are 2+ on the right side and 2+ on the left side.      Patellar reflexes are 2+ on the right side and 2+ on the left side. Psychiatric:        Speech: Speech normal.         Behavior: Behavior normal.        Thought Content: Thought content normal.     Assessment/Plan: Health Maintenance Due  Topic Date Due  . PAP SMEAR-Modifier  07/03/2016  . INFLUENZA VACCINE  02/01/2020  Health Maintenance reviewed - colonoscopy is up to date. Mammogram up to date. Weight loss secondary to home working project.  1. Preventative health care Keep up with regular walking. She is up to date with preventative health care today.  - Tdap vaccine greater than or equal to 7yo IM  I have asked her to monitor her blood pressure at home and let me know if pressures are running high at home.  She is under significant stress today, and blood pressure remain the same even on my recheck.    Return for return for nurse visit for second shingrix in 2-6 months.  [name], MD

## 2020-03-03 LAB — CYTOLOGY - PAP
Comment: NEGATIVE
Diagnosis: NEGATIVE
Diagnosis: REACTIVE
High risk HPV: NEGATIVE

## 2020-04-19 ENCOUNTER — Other Ambulatory Visit: Payer: Self-pay | Admitting: Family Medicine

## 2020-05-03 ENCOUNTER — Other Ambulatory Visit: Payer: Self-pay

## 2020-05-03 ENCOUNTER — Ambulatory Visit (INDEPENDENT_AMBULATORY_CARE_PROVIDER_SITE_OTHER): Payer: PRIVATE HEALTH INSURANCE | Admitting: *Deleted

## 2020-05-03 DIAGNOSIS — Z23 Encounter for immunization: Secondary | ICD-10-CM | POA: Diagnosis not present

## 2020-10-03 ENCOUNTER — Emergency Department
Admission: EM | Admit: 2020-10-03 | Discharge: 2020-10-03 | Disposition: A | Discharge status: Home / Self Care | Payer: PRIVATE HEALTH INSURANCE | Source: Home / Self Care

## 2020-10-03 ENCOUNTER — Telehealth: Payer: Self-pay | Admitting: Family Medicine

## 2020-10-03 ENCOUNTER — Encounter: Payer: Self-pay | Admitting: Emergency Medicine

## 2020-10-03 DIAGNOSIS — M795 Residual foreign body in soft tissue: Secondary | ICD-10-CM | POA: Diagnosis not present

## 2020-10-03 DIAGNOSIS — M79645 Pain in left finger(s): Secondary | ICD-10-CM

## 2020-10-03 DIAGNOSIS — L03114 Cellulitis of left upper limb: Secondary | ICD-10-CM

## 2020-10-03 MED ORDER — CEPHALEXIN 500 MG PO CAPS: 500.0000 mg | ORAL_CAPSULE | Freq: Two times a day (BID) | ORAL | 0 refills | Status: AC

## 2020-10-03 NOTE — Telephone Encounter (Signed)
Antibiotic did not go through to pharmacy.

## 2020-10-03 NOTE — ED Provider Notes (Signed)
[name]    CSN: [id number] Arrival date & time: 10/03/20  1016      History   Chief Complaint Chief Complaint  Patient presents with  . Foreign Body    HPI Darlene Webb is a 55 y.o. female.   Reports that she was trimming bushes at home about 2 weeks ago, and she thinks that there is a thorn stuck in her left thumb.  Reports that the area is raised, red, has been draining green fluid.  She reports the area is also very tender, she has not been able to extract any foreign body from the area has not attempted other OTC treatment.  Denies radiating pain, red streaking up the arm, fever, nausea, vomiting, diarrhea, rash, other symptoms.  ROS  The history is provided by the patient.    Past Medical History:  Diagnosis Date  . Anesthesia complication    sensitive to sedation per pt!    Patient Active Problem List   Diagnosis Date Noted  . Colon polyps 10/17/2019    Past Surgical History:  Procedure Laterality Date  . LAPAROSCOPIC OVARIAN CYSTECTOMY    . URETHRA SURGERY     stretched as an infant    OB History   No obstetric history on file.      Home Medications    Prior to Admission medications   Medication Sig Start Date End Date Taking? Authorizing Provider  CALCIUM PO Take by mouth.   Yes [provider]  Cyanocobalamin (VITAMIN B-12 PO) Take by mouth.   Yes [provider]  cephALEXin (KEFLEX) 500 MG capsule Take 1 capsule (500 mg total) by mouth 2 (two) times daily for 7 days. 10/03/20 10/10/20  Faustino Congress, NP  Vitamin D, Ergocalciferol, (DRISDOL) 1.25 MG (50000 UNIT) CAPS capsule TAKE 1 CAPSULE (50,000 UNITS TOTAL) BY MOUTH EVERY 7 (SEVEN) DAYS. 12/30/19   Caren Macadam, MD    Family History Family History  Problem Relation Age of Onset  . Colon polyps Mother   . Parkinson's disease Father 75  . Healthy Sister   . Colon cancer Maternal Grandfather   . Colon cancer Maternal Great-grandfather   .  Lung cancer Maternal Grandmother        smoker  . Multiple myeloma Paternal Grandfather   . Alopecia Son   . Eczema Daughter   . Esophageal cancer Neg Hx   . Rectal cancer Neg Hx   . Stomach cancer Neg Hx     Social History Social History   Tobacco Use  . Smoking status: Never Smoker  . Smokeless tobacco: Never Used  Vaping Use  . Vaping Use: Never used  Substance Use Topics  . Alcohol use: Yes    Alcohol/week: 7.0 standard drinks    Types: 7 Cans of beer per week  . Drug use: No     Allergies   Patient has no known allergies.   Review of Systems Review of Systems   Physical Exam Triage Vital Signs ED Triage Vitals [10/03/20 1027]  Enc Vitals Group     BP (!) 155/84     Pulse Rate 69     Resp      Temp 98.9 F (37.2 C)     Temp Source Oral     SpO2 98 %     Weight      Height      Head Circumference      Peak Flow      Pain Score 1  Pain Loc      Pain Edu?      Excl. in [place]?    No data found.  Updated Vital Signs BP (!) 155/84 (BP Location: Left Arm)   Pulse 69   Temp 98.9 F (37.2 C) (Oral)   LMP 11/19/2015   SpO2 98%   Visual Acuity Right Eye Distance:   Left Eye Distance:   Bilateral Distance:    Right Eye Near:   Left Eye Near:    Bilateral Near:     Physical Exam   UC Treatments / Results  Labs (all labs ordered are listed, but only abnormal results are displayed) Labs Reviewed - No data to display  EKG   Radiology No results found.  Procedures Foreign Body Removal  Date/Time: 10/07/2020 1:25 PM Performed by: Faustino Congress, NP Authorized by: Faustino Congress, NP   Consent:    Consent obtained:  Verbal   Consent given by:  Patient   Risks discussed:  Bleeding, infection and incomplete removal   Alternatives discussed:  Alternative treatment Universal protocol:    Patient identity confirmed:  Verbally with patient and arm band Location:    Location:  Finger   Finger location:  L thumb   Depth:   Subcutaneous   Tendon involvement:  None Pre-procedure details:    Imaging:  None Anesthesia:    Anesthesia method:  Topical application (Attempted local infiltration, patient could not tolerate)   Topical anesthetic:  LET Procedure type:    Procedure complexity:  Simple Procedure details:    Foreign bodies recovered:  None Post-procedure details:    Skin closure:  None   Procedure completion:  Procedure terminated at patient's request Comments:     Attempted to anesthetize the area with local infiltration of 1% lidocaine without epinephrine to the pad of the left thumb, patient requested to stop the procedure she cannot tolerate the anesthesia.  Applied let to the area, and this was not enough anesthesia to create an incision without an undue amount of pain.  Procedure stopped at patient's request.  Unable to retrieve foreign body from the left thumb.   (including critical care time)  Medications Ordered in UC Medications - No data to display  Initial Impression / Assessment and Plan / UC Course  I have reviewed the triage vital signs and the nursing notes.  Pertinent labs & imaging results that were available during my care of the patient were reviewed by me and considered in my medical decision making (see chart for details).    Left thumb pain Foreign body to left thumb  Attempted to remove foreign body from the left thumb Patient could not tolerate anesthesia procedure Procedure stopped of patient's request Was able to incise a bit of callus that had formed over the area to the pad of the left thumb Instructed patient to leave the area open And to use a paste of baking soda and water to the area to try to drop the foreign body Follow-up with primary care as needed Follow-up with the ER for high fever, red streaking up the hand and arm, nausea, vomiting, trouble breathing, trouble swallowing, other concerning symptoms  Final Clinical Impressions(s) / UC Diagnoses   Final  diagnoses:  Foreign body in soft tissue  Pain of left thumb   Discharge Instructions   None    ED Prescriptions    None     PDMP not reviewed this encounter.   Faustino Congress, NP 10/07/20 1330

## 2020-10-03 NOTE — ED Triage Notes (Signed)
Patient states that she was trimming bushes about 2 weeks ago, got a "thorn" in her left thumb.  Patient has tried to remove it but is unable.  Area is red with green drainage, some swelling.

## 2020-10-07 DIAGNOSIS — M795 Residual foreign body in soft tissue: Secondary | ICD-10-CM

## 2020-10-07 NOTE — Discharge Instructions (Addendum)
We are not able to retrieve the sword from the pad of your left thumb  We will cover for cellulitis given green drainage, erythema, tenderness to the area with Keflex twice a day for 7 days.  Follow-up with PCP as needed

## 2020-11-03 ENCOUNTER — Encounter: Payer: Self-pay | Admitting: Family Medicine

## 2021-09-09 ENCOUNTER — Ambulatory Visit (INDEPENDENT_AMBULATORY_CARE_PROVIDER_SITE_OTHER): Payer: No Typology Code available for payment source | Admitting: Family Medicine

## 2021-09-09 ENCOUNTER — Encounter: Payer: Self-pay | Admitting: Family Medicine

## 2021-09-09 VITALS — BP 116/80 | HR 75 | Temp 97.7°F | Ht 62.5 in | Wt 128.6 lb

## 2021-09-09 DIAGNOSIS — M67449 Ganglion, unspecified hand: Secondary | ICD-10-CM

## 2021-09-09 DIAGNOSIS — Z131 Encounter for screening for diabetes mellitus: Secondary | ICD-10-CM

## 2021-09-09 DIAGNOSIS — E559 Vitamin D deficiency, unspecified: Secondary | ICD-10-CM | POA: Diagnosis not present

## 2021-09-09 DIAGNOSIS — Z13220 Encounter for screening for lipoid disorders: Secondary | ICD-10-CM

## 2021-09-09 DIAGNOSIS — E538 Deficiency of other specified B group vitamins: Secondary | ICD-10-CM | POA: Diagnosis not present

## 2021-09-09 DIAGNOSIS — L720 Epidermal cyst: Secondary | ICD-10-CM

## 2021-09-09 DIAGNOSIS — Z0000 Encounter for general adult medical examination without abnormal findings: Secondary | ICD-10-CM

## 2021-09-09 DIAGNOSIS — Z1231 Encounter for screening mammogram for malignant neoplasm of breast: Secondary | ICD-10-CM

## 2021-09-09 LAB — CBC WITH DIFFERENTIAL/PLATELET
Basophils Absolute: 0.1 10*3/uL (ref 0.0–0.1)
Basophils Relative: 1 % (ref 0.0–3.0)
Eosinophils Absolute: 0.4 10*3/uL (ref 0.0–0.7)
Eosinophils Relative: 5.8 % — ABNORMAL HIGH (ref 0.0–5.0)
HCT: 37.4 % (ref 36.0–46.0)
Hemoglobin: 12.6 g/dL (ref 12.0–15.0)
Lymphocytes Relative: 30.2 % (ref 12.0–46.0)
Lymphs Abs: 2 10*3/uL (ref 0.7–4.0)
MCHC: 33.8 g/dL (ref 30.0–36.0)
MCV: 91.1 fl (ref 78.0–100.0)
Monocytes Absolute: 0.4 10*3/uL (ref 0.1–1.0)
Monocytes Relative: 5.6 % (ref 3.0–12.0)
Neutro Abs: 3.8 10*3/uL (ref 1.4–7.7)
Neutrophils Relative %: 57.4 % (ref 43.0–77.0)
Platelets: 338 10*3/uL (ref 150.0–400.0)
RBC: 4.11 Mil/uL (ref 3.87–5.11)
RDW: 13.3 % (ref 11.5–15.5)
WBC: 6.6 10*3/uL (ref 4.0–10.5)

## 2021-09-09 LAB — COMPREHENSIVE METABOLIC PANEL
ALT: 15 U/L (ref 0–35)
AST: 19 U/L (ref 0–37)
Albumin: 4.6 g/dL (ref 3.5–5.2)
Alkaline Phosphatase: 65 U/L (ref 39–117)
BUN: 11 mg/dL (ref 6–23)
CO2: 27 mEq/L (ref 19–32)
Calcium: 9.7 mg/dL (ref 8.4–10.5)
Chloride: 101 mEq/L (ref 96–112)
Creatinine, Ser: 0.73 mg/dL (ref 0.40–1.20)
GFR: 91.51 mL/min (ref >60.00)
Glucose, Bld: 91 mg/dL (ref 70–99)
Potassium: 3.7 mEq/L (ref 3.5–5.1)
Sodium: 138 mEq/L (ref 135–145)
Total Bilirubin: 0.9 mg/dL (ref 0.2–1.2)
Total Protein: 8 g/dL (ref 6.0–8.3)

## 2021-09-09 LAB — LIPID PANEL
Cholesterol: 210 mg/dL — ABNORMAL HIGH (ref 0–200)
HDL: 83.5 mg/dL (ref >39.00)
LDL Cholesterol: 110 mg/dL — ABNORMAL HIGH (ref 0–99)
NonHDL: 126.9
Total CHOL/HDL Ratio: 3
Triglycerides: 85 mg/dL (ref 0.0–149.0)
VLDL: 17 mg/dL (ref 0.0–40.0)

## 2021-09-09 LAB — VITAMIN D 25 HYDROXY (VIT D DEFICIENCY, FRACTURES): VITD: 30.22 ng/mL (ref 30.00–100.00)

## 2021-09-09 LAB — VITAMIN B12: Vitamin B-12: 968 pg/mL — ABNORMAL HIGH (ref 211–911)

## 2021-09-09 NOTE — Progress Notes (Signed)
Darlene Webb ?DOB: 10-23-1965 ?Encounter date: 09/09/2021 ? ?This is a 56 y.o. female who presents for complete physical  ? ?History of present illness/Additional concerns: ?Last visit with me was 03/01/2020 for complete physical. ? ?2 lumps one on finger, one on back. One on back she thinks was inflamed, and has drained. Had boil on back years ago that was surgically drained. Lump on ring finger. Not sore. Not getting bigger. Has been there over a year. ? ?Repeat colonoscopy due 08/2024 (family history of colon cancer) ?Pap completed at last visit; repeat due 01/2025 (normal pap, negative hpv). No vaginal concerns, issues.  ?Last mammogram: 11/24/19.  ? ?Taking B12, iron, calcium daily. Started iron after getting turned away giving blood.  ? ? ?Past Medical History:  ?Diagnosis Date  ? Anesthesia complication   ? sensitive to sedation per pt!  ? ?Past Surgical History:  ?Procedure Laterality Date  ? LAPAROSCOPIC OVARIAN CYSTECTOMY    ? URETHRA SURGERY    ? stretched as an infant  ? ?No Known Allergies ?Current Meds  ?Medication Sig  ? CALCIUM PO Take by mouth.  ? Cyanocobalamin (VITAMIN B-12 PO) Take by mouth.  ? Ferrous Sulfate (IRON PO) Take by mouth daily.  ? ?Social History  ? ?Tobacco Use  ? Smoking status: Never  ? Smokeless tobacco: Never  ?Substance Use Topics  ? Alcohol use: Yes  ?  Alcohol/week: 7.0 standard drinks  ?  Types: 7 Cans of beer per week  ? ?Family History  ?Problem Relation Age of Onset  ? Colon polyps Mother   ? Parkinson's disease Father 70  ? Healthy Sister   ? Colon cancer Maternal Grandfather   ? Colon cancer Maternal Great-grandfather   ? Lung cancer Maternal Grandmother   ?     smoker  ? Multiple myeloma Paternal Grandfather   ? Alopecia Son   ? Eczema Daughter   ? Esophageal cancer Neg Hx   ? Rectal cancer Neg Hx   ? Stomach cancer Neg Hx   ? ? ? ?Review of Systems  ?Constitutional:  Negative for activity change, appetite change, chills, fatigue, fever and unexpected weight change.   ?HENT:  Negative for congestion, ear pain, hearing loss, sinus pressure, sinus pain, sore throat and trouble swallowing.   ?Eyes:  Negative for pain and visual disturbance.  ?Respiratory:  Negative for cough, chest tightness, shortness of breath and wheezing.   ?Cardiovascular:  Negative for chest pain, palpitations and leg swelling.  ?Gastrointestinal:  Negative for abdominal pain, blood in stool, constipation, diarrhea, nausea and vomiting.  ?Genitourinary:  Negative for difficulty urinating and menstrual problem.  ?Musculoskeletal:  Negative for arthralgias and back pain.  ?Skin:  Negative for rash.  ?     See HPI.  ?Neurological:  Negative for dizziness, weakness, numbness and headaches.  ?Hematological:  Negative for adenopathy. Does not bruise/bleed easily.  ?Psychiatric/Behavioral:  Negative for sleep disturbance and suicidal ideas. The patient is not nervous/anxious.   ? ?CBC:  ?Lab Results  ?Component Value Date  ? WBC 7.2 10/17/2019  ? HGB 11.7 10/17/2019  ? HCT 35.8 10/17/2019  ? MCH 30.0 10/17/2019  ? MCHC 32.7 10/17/2019  ? RDW 11.9 10/17/2019  ? PLT 456 (H) 10/17/2019  ? MPV 8.9 10/17/2019  ? ?CMP: ?Lab Results  ?Component Value Date  ? NA 141 10/17/2019  ? K 4.3 10/17/2019  ? CL 106 10/17/2019  ? CO2 27 10/17/2019  ? GLUCOSE 95 10/17/2019  ? BUN 11 10/17/2019  ?  CREATININE 0.71 10/17/2019  ? CALCIUM 9.3 10/17/2019  ? PROT 7.0 10/17/2019  ? BILITOT 0.7 10/17/2019  ? ALT 15 10/17/2019  ? AST 14 10/17/2019  ? ?LIPID: ?Lab Results  ?Component Value Date  ? CHOL 215 (H) 10/17/2019  ? TRIG 67 10/17/2019  ? HDL 84 10/17/2019  ? LDLCALC 115 (H) 10/17/2019  ? ? ?Objective: ? ?BP 116/80 (BP Location: Left Arm, Patient Position: Sitting, Cuff Size: Normal)   Pulse 75   Temp 97.7 ?F (36.5 ?C) (Oral)   Ht 5' 2.5" (1.588 m)   Wt 128 lb 9.6 oz (58.3 kg)   LMP 11/19/2015   SpO2 98%   BMI 23.15 kg/m?   Weight: 128 lb 9.6 oz (58.3 kg)  ? ?BP Readings from Last 3 Encounters:  ?09/09/21 116/80  ?10/03/20 (!)  155/84  ?03/01/20 138/80  ? ?Wt Readings from Last 3 Encounters:  ?09/09/21 128 lb 9.6 oz (58.3 kg)  ?03/01/20 118 lb 12.8 oz (53.9 kg)  ?10/17/19 127 lb 3.2 oz (57.7 kg)  ? ? ?Physical Exam ?Constitutional:   ?   General: She is not in acute distress. ?   Appearance: She is well-developed.  ?HENT:  ?   Head: Normocephalic and atraumatic.  ?   Right Ear: External ear normal.  ?   Left Ear: External ear normal.  ?   Mouth/Throat:  ?   Pharynx: No oropharyngeal exudate.  ?Eyes:  ?   Conjunctiva/sclera: Conjunctivae normal.  ?   Pupils: Pupils are equal, round, and reactive to light.  ?Neck:  ?   Thyroid: No thyromegaly.  ?Cardiovascular:  ?   Rate and Rhythm: Normal rate and regular rhythm.  ?   Heart sounds: Normal heart sounds. No murmur heard. ?  No friction rub. No gallop.  ?Pulmonary:  ?   Effort: Pulmonary effort is normal.  ?   Breath sounds: Normal breath sounds.  ?Abdominal:  ?   General: Bowel sounds are normal. There is no distension.  ?   Palpations: Abdomen is soft. There is no mass.  ?   Tenderness: There is no abdominal tenderness. There is no guarding.  ?   Hernia: No hernia is present.  ?Musculoskeletal:     ?   General: No tenderness or deformity. Normal range of motion.  ?   Cervical back: Normal range of motion and neck supple.  ?Lymphadenopathy:  ?   Cervical: No cervical adenopathy.  ?Skin: ?   General: Skin is warm and dry.  ?   Findings: No rash.  ?   Comments: 1.25 cm lightly erythematous papule mid upper back.  No fluctuance or tenderness with palpation.  Appears to be epidermal cyst that has drained. ? ?Palmer ring finger mobile 0.4 cm cyst, nontender.  ?Neurological:  ?   Mental Status: She is alert and oriented to person, place, and time.  ?   Deep Tendon Reflexes: Reflexes normal.  ?   Reflex Scores: ?     Tricep reflexes are 2+ on the right side and 2+ on the left side. ?     Bicep reflexes are 2+ on the right side and 2+ on the left side. ?     Brachioradialis reflexes are 2+ on the  right side and 2+ on the left side. ?     Patellar reflexes are 2+ on the right side and 2+ on the left side. ?Psychiatric:     ?   Speech: Speech normal.     ?  Behavior: Behavior normal.     ?   Thought Content: Thought content normal.  ? ? ?Assessment/Plan: ?There are no preventive care reminders to display for this patient. ? ?Health Maintenance reviewed. ? ?1. Preventative health care ?Keep up with regular exercise and healthy eating.  She has gotten off track a little bit with eating and has been slightly less active than normal, to which she attributes her weight gain.  She plans to get back on track with this. ? ?2. Low serum vitamin B12 ?- CBC with Differential/Platelet; Future ?- Vitamin B12; Future ?- Homocysteine; Future ?- Methylmalonic acid, serum; Future ?- Methylmalonic acid, serum ?- Homocysteine ?- Vitamin B12 ?- CBC with Differential/Platelet ? ?3. Vitamin D deficiency ?- VITAMIN D 25 Hydroxy (Vit-D Deficiency, Fractures); Future ?- VITAMIN D 25 Hydroxy (Vit-D Deficiency, Fractures) ? ?4. Lipid screening ?- Lipid panel; Future ?- Lipid panel ? ?5. Screening for diabetes mellitus ?- Comprehensive metabolic panel; Future ?- Comprehensive metabolic panel ? ?6. Encounter for screening mammogram for malignant neoplasm of breast ?- MM DIGITAL SCREENING BILATERAL; Future ? ?7. Ganglion cyst of finger ?She prefers to monitor for now.  If this starts to enlarge or is bothering her, okay for surgical referral. ? ?8. Epidermoid cyst of skin of back ?Not currently infected.  If any increased redness or tenderness, okay to treat with antibiotics and consider I&D if needed.  Discussed that best time to remove cyst (if it persists) is 1 skin is noninfected. ? ? ?Return in about 1 year (around 09/10/2022) for physical exam. ? ?[name], MD ? ? ? ? ? ?

## 2021-09-13 LAB — METHYLMALONIC ACID, SERUM: Methylmalonic Acid, Quant: 127 nmol/L (ref 87–318)

## 2021-09-13 LAB — HOMOCYSTEINE: Homocysteine: 8.7 umol/L (ref <10.4)

## 2021-12-02 IMAGING — CT CT MAXILLOFACIAL W/O CM
3 of 5 series · 14 of 47 positions shown, 16 images · non-contrast
Comparison: None.

CLINICAL DATA: 54-year-old female with pain, pressure, congestion,
some nasal bleeding. Nasal polyps.

EXAM:
CT MAXILLOFACIAL WITHOUT CONTRAST
TECHNIQUE: Multidetector CT images of the paranasal sinuses were obtained using
the standard protocol without intravenous contrast.

[Series 4: sinus 2.00 hr60 s3 cor · coronal · 0.27mm/px · 3 of 83 slices shown]
[im 28/83  bone]
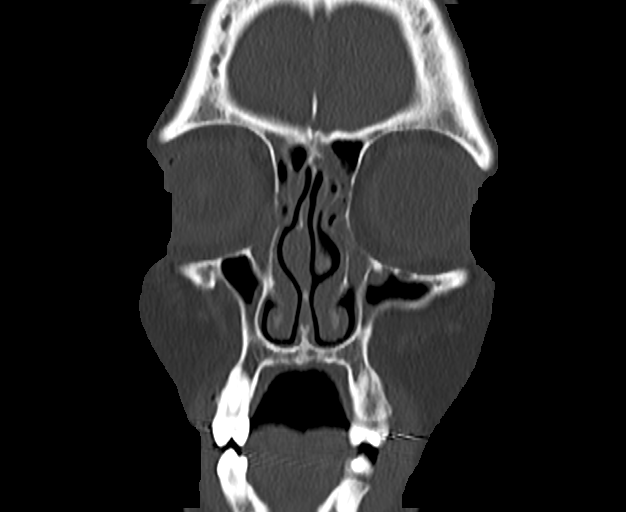
[im 37/83  bone]
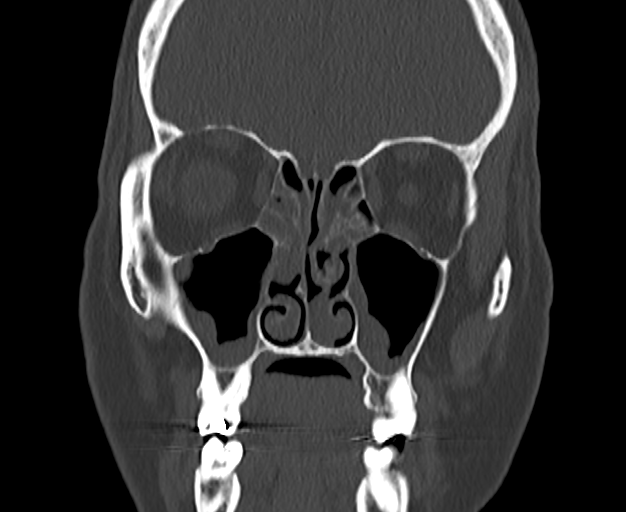
[im 46/83  bone]
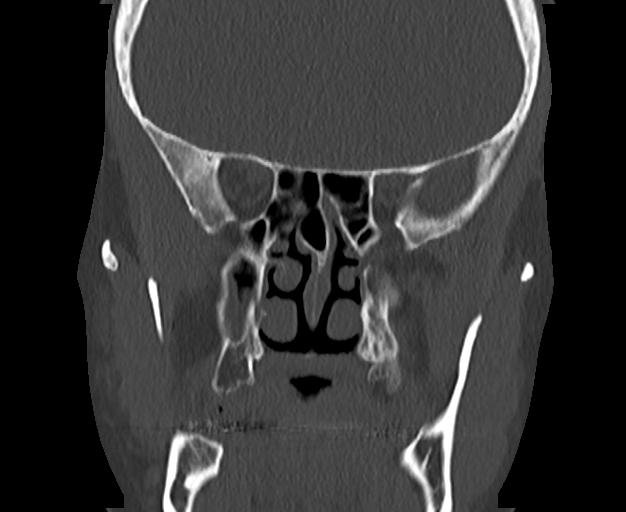

[Series 6: sinus 2.00 hr60 s3 sag · sagittal · 0.27mm/px · 3 of 83 slices shown]
[im 28/83  bone]
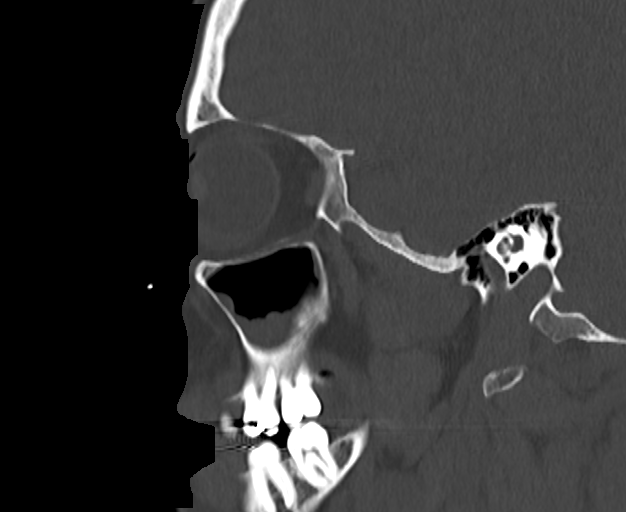
[im 42/83  bone]
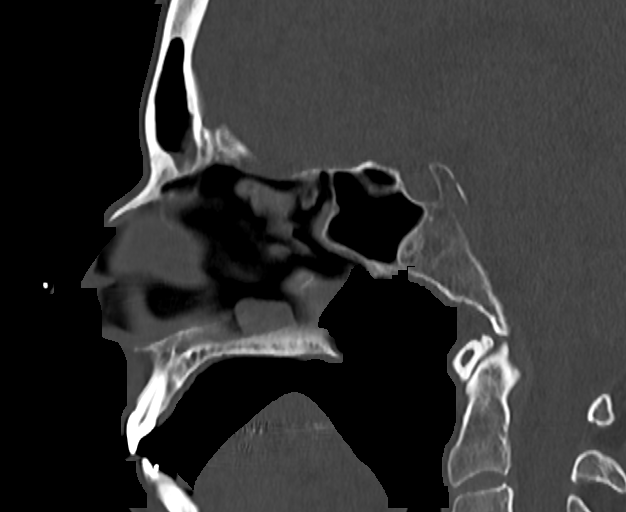
[im 55/83  bone]
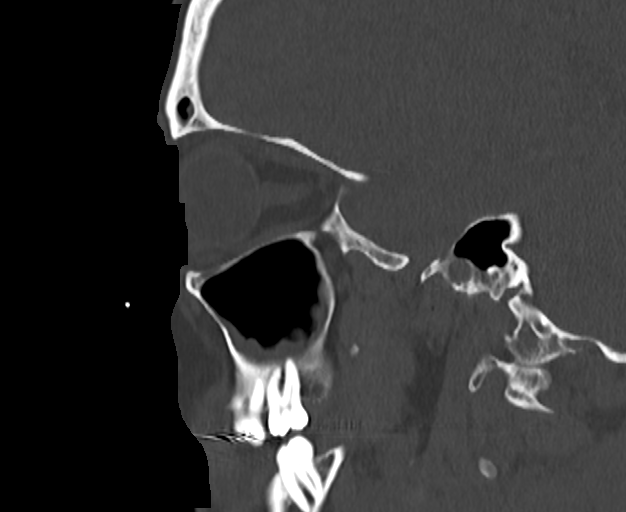

[Series 12: sinus 1.00 hr60 s3 axial fusion thins · axial · 0.33mm/px · z∈[-651,-546]mm · 8 of 227 slices shown, 10 images]
[im 26/227  brain]
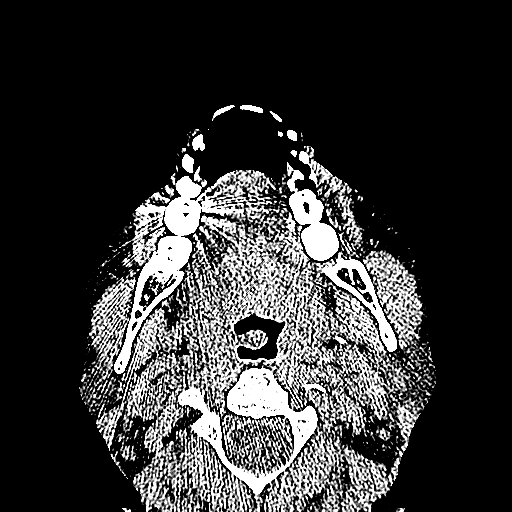
[im 26/227  bone]
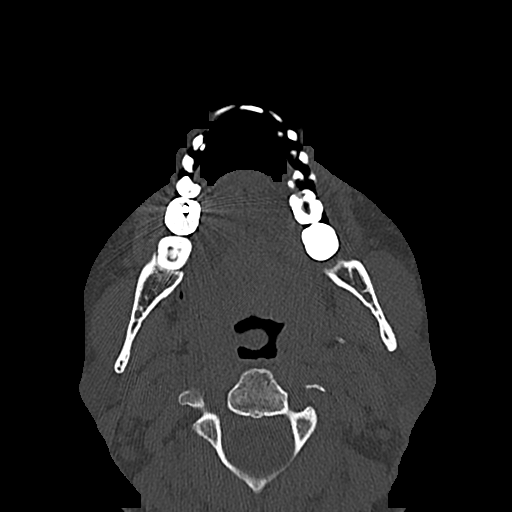
[im 51/227  bone]
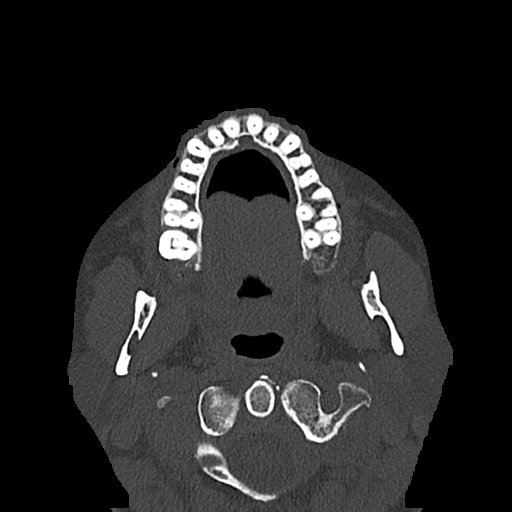
[im 76/227  bone]
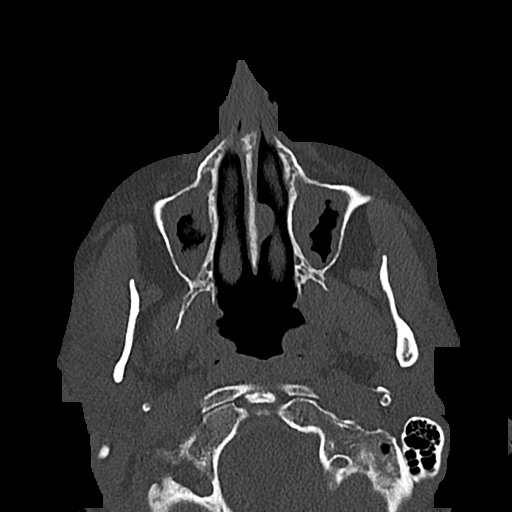
[im 101/227  bone]
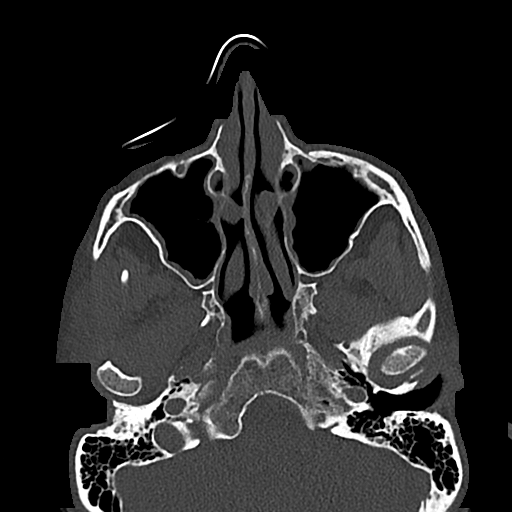
[im 126/227  brain]
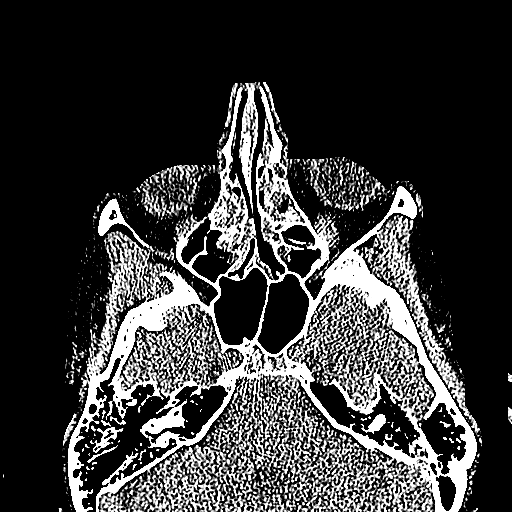
[im 126/227  bone]
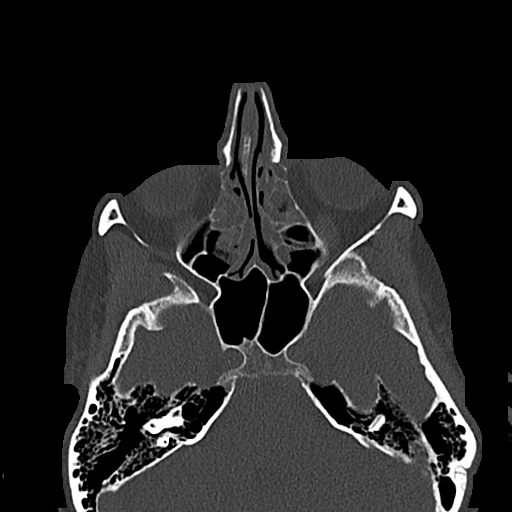
[im 151/227  bone]
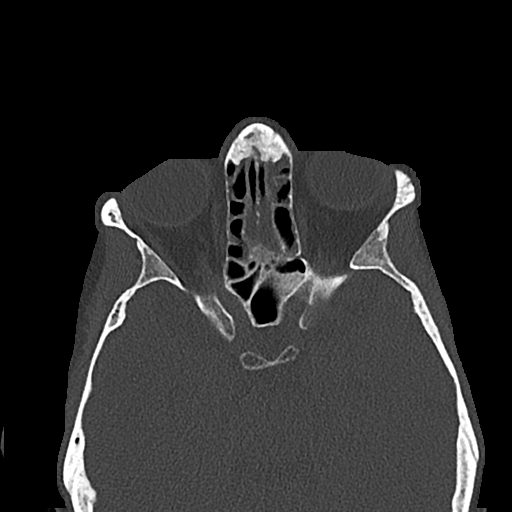
[im 176/227  bone]
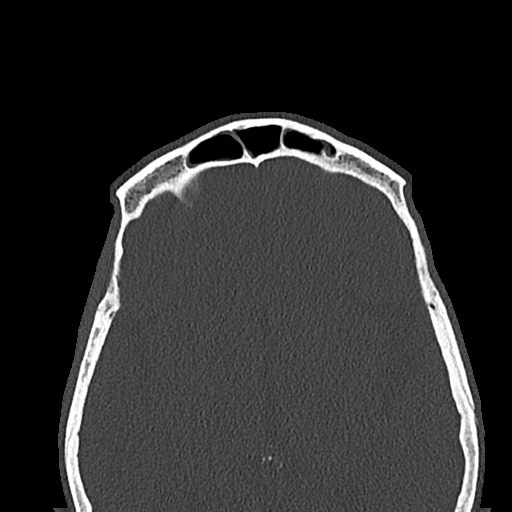
[im 201/227  bone]
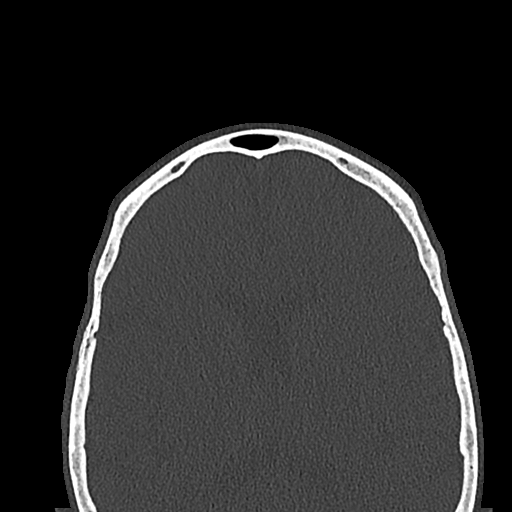

[14 of 47 positions shown; findings below may reference images not displayed]

FINDINGS: Paranasal sinuses:

Frontal: Mild to moderate bilateral frontal sinus and
frontoethmoidal recess mucosal thickening.

Ethmoid: Moderate bilateral ethmoid sinus mucosal thickening and
opacification.

Maxillary: Mild to moderate bilateral maxillary sinus mucosal
thickening, most pronounced in the alveolar recesses. Bubbly opacity
in the right maxillary sinus.

Sphenoid: Minimal sphenoid sinus mucosal thickening, at the sphenoid
ostia on series 2, image 29.

Right ostiomeatal unit: Opacified (coronal image 34).

Left ostiomeatal unit: Opacified (coronal image 32).

Nasal passages: Nasal septum is intact with mild rightward
deviation. Symmetric nasal cavity mucosal thickening. Occasional
superimposed polypoid opacities, including a 15 mm long polyp in the
left inferior meatus (series 2, image 44). Some superimposed
retained secretions. The olfactory recesses remain pneumatized.

Anatomy:

Anterior ethmoidal artery position suspected on coronal image 36
with no pneumatization superior to the notches.

Keros type 2 olfactory fossa.

Sellar sphenoid pneumatization pattern.

There is left clinoid process pneumatization (coronal image 51).

Other: Negative visible noncontrast brain parenchyma. Visualized
orbits and scalp soft tissues are within normal limits. Negative
visible noncontrast deep soft tissue spaces of the face.

Bilateral tympanic cavities, mastoids, petrous apex air cells are
clear.

No acute maxillary dental finding. No acute osseous abnormality
identified.
IMPRESSION: Symmetric nasal cavity mucosal thickening and widespread mild to
moderate paranasal sinus mucosal thickening, relatively sparing the
sphenoid sinuses. 15 mm polypoid opacity in the left nasal inferior
meatus.

## 2022-06-27 ENCOUNTER — Encounter: Payer: Self-pay | Admitting: Family Medicine

## 2022-06-27 ENCOUNTER — Ambulatory Visit (INDEPENDENT_AMBULATORY_CARE_PROVIDER_SITE_OTHER): Payer: No Typology Code available for payment source | Admitting: Family Medicine

## 2022-06-27 VITALS — BP 112/80 | HR 76 | Temp 98.1°F | Ht 62.5 in | Wt 130.3 lb

## 2022-06-27 DIAGNOSIS — N949 Unspecified condition associated with female genital organs and menstrual cycle: Secondary | ICD-10-CM | POA: Diagnosis not present

## 2022-06-27 DIAGNOSIS — Z23 Encounter for immunization: Secondary | ICD-10-CM | POA: Diagnosis not present

## 2022-06-27 NOTE — Progress Notes (Signed)
   Established Patient Office Visit  Subjective   Patient ID: Darlene Webb, female    DOB: 16-Feb-1966  Age: 56 y.o. MRN: [id number]  Chief Complaint  Patient presents with   Mass    Patient noticed a vaginal lump while taking a shower 3 days ago, denies pain or discharge    Patient is reporting a new vaginal lump she noticed, no pain, on the right side, halfway up the canal, no fever/chills, no unusual vaginal discharge, no itching. No vaginal bleeding, she is post-menopausal currently. Last pap was in 2021 and was negative.       Review of Systems  All other systems reviewed and are negative.     Objective:     BP 112/80 (BP Location: Left Arm, Patient Position: Sitting, Cuff Size: Normal)   Pulse 76   Temp 98.1 F (36.7 C) (Oral)   Ht 5' 2.5" (1.588 m)   Wt 130 lb 4.8 oz (59.1 kg)   LMP 11/19/2015   SpO2 98%   BMI 23.45 kg/m    Physical Exam Exam conducted with a chaperone present.  Genitourinary:    General: Normal vulva.     Exam position: Lithotomy position.     Labia:        Right: No rash, tenderness or lesion.        Left: No rash, tenderness or lesion.      Vagina: Normal. No vaginal discharge, tenderness or lesions.     Cervix: Normal.      No results found for any visits on 06/27/22.    The 10-year ASCVD risk score (Arnett DK, et al., 2019) is: 1.4%    Assessment & Plan:   Problem List Items Addressed This Visit   None Visit Diagnoses     Vaginal lump    -  Primary   Relevant Orders   Visual inspection of the vaginal wall and palpation of the interior of the vaginal wall did not reveal any abnormal lumps or abnormal tissue. I recommended that the patient be referred to GYN for a second opinion to confirm my findings. Referral to GYN has been placed.  Ambulatory referral to Gynecology       No follow-ups on file.    Farrel Conners, MD

## 2022-07-06 ENCOUNTER — Ambulatory Visit (INDEPENDENT_AMBULATORY_CARE_PROVIDER_SITE_OTHER): Payer: No Typology Code available for payment source | Admitting: Family Medicine

## 2022-07-06 ENCOUNTER — Encounter: Payer: Self-pay | Admitting: Family Medicine

## 2022-07-06 VITALS — BP 120/80 | HR 77 | Temp 98.0°F | Ht 62.5 in | Wt 128.7 lb

## 2022-07-06 DIAGNOSIS — Z1231 Encounter for screening mammogram for malignant neoplasm of breast: Secondary | ICD-10-CM

## 2022-07-06 DIAGNOSIS — R293 Abnormal posture: Secondary | ICD-10-CM

## 2022-07-06 NOTE — Progress Notes (Signed)
   Established Patient Office Visit  Subjective   Patient ID: Darlene Webb, female    DOB: 06-03-1966  Age: 57 y.o. MRN: [id number]  Chief Complaint  Patient presents with   Establish Care    Patient reports that she has her appointment with the GYN scheduled on 07/20/22. States that she can still feel the lump there but no other new complaints or issues to report.   Poor posture- pt reports she is hoping to improve her posture and she is asking for advice regarding the braces she wants to try for her back. We discussed the braces and also different exercises that can improve posture.   Current Outpatient Medications  Medication Instructions   CALCIUM PO Oral   Cyanocobalamin (VITAMIN B-12 PO) Oral   Ferrous Sulfate (IRON PO) Oral, Daily    Patient Active Problem List   Diagnosis Date Noted   Colon polyps 10/17/2019      Review of Systems  All other systems reviewed and are negative.     Objective:     BP 120/80 (BP Location: Left Arm, Patient Position: Sitting, Cuff Size: Normal)   Pulse 77   Temp 98 F (36.7 C) (Oral)   Ht 5' 2.5" (1.588 m)   Wt 128 lb 11.2 oz (58.4 kg)   LMP 11/19/2015   SpO2 98%   BMI 23.16 kg/m    Physical Exam Vitals reviewed.  Constitutional:      Appearance: Normal appearance. She is well-groomed and normal weight.  Eyes:     Conjunctiva/sclera: Conjunctivae normal.  Neck:     Thyroid: No thyromegaly.  Cardiovascular:     Rate and Rhythm: Normal rate and regular rhythm.     Pulses: Normal pulses.     Heart sounds: S1 normal and S2 normal.  Pulmonary:     Effort: Pulmonary effort is normal.     Breath sounds: Normal breath sounds and air entry.  Musculoskeletal:     Right lower leg: No edema.     Left lower leg: No edema.  Neurological:     Mental Status: She is alert and oriented to person, place, and time. Mental status is at baseline.     Gait: Gait is intact.  Psychiatric:        Mood and Affect: Mood and affect  normal.        Speech: Speech normal.        Behavior: Behavior normal.        Judgment: Judgment normal.      No results found for any visits on 07/06/22.    The 10-year ASCVD risk score (Arnett DK, et al., 2019) is: 1.6%    Assessment & Plan:   Problem List Items Addressed This Visit   None Visit Diagnoses     Encounter for screening mammogram for malignant neoplasm of breast    -  Primary   Relevant Orders   MM Digital Screening   Poor posture      We discussed the braces to improve posture and I gave her a website link to start working on the exercises to improve posture. Will follow up in 1 year for her annual physical.     Return in about 1 year (around 07/07/2023) for Annual Physical Exam.    Farrel Conners, MD

## 2022-07-06 NOTE — Patient Instructions (Signed)
[email or web address]  Good website for the exercises

## 2022-07-20 ENCOUNTER — Ambulatory Visit: Payer: Self-pay | Admitting: Radiology

## 2022-12-25 ENCOUNTER — Encounter: Payer: Self-pay | Admitting: Family Medicine

## 2022-12-25 ENCOUNTER — Ambulatory Visit (INDEPENDENT_AMBULATORY_CARE_PROVIDER_SITE_OTHER): Payer: No Typology Code available for payment source | Admitting: Family Medicine

## 2022-12-25 VITALS — BP 140/80 | HR 85 | Temp 98.6°F | Ht 62.0 in | Wt 123.1 lb

## 2022-12-25 DIAGNOSIS — Z0000 Encounter for general adult medical examination without abnormal findings: Secondary | ICD-10-CM | POA: Diagnosis not present

## 2022-12-25 DIAGNOSIS — Z862 Personal history of diseases of the blood and blood-forming organs and certain disorders involving the immune mechanism: Secondary | ICD-10-CM | POA: Diagnosis not present

## 2022-12-25 DIAGNOSIS — Z13220 Encounter for screening for lipoid disorders: Secondary | ICD-10-CM | POA: Diagnosis not present

## 2022-12-25 NOTE — Addendum Note (Signed)
Addended by: Marian Sorrow D on: 12/25/2022 04:06 PM   Modules accepted: Orders

## 2022-12-25 NOTE — Patient Instructions (Addendum)
1200 mg daily of calcium  800 units daily of vitamin D  250 mcg daily of B12  65 mg iron daily    Health Maintenance, Female Adopting a healthy lifestyle and getting preventive care are important in promoting health and wellness. Ask your health care provider about: The right schedule for you to have regular tests and exams. Things you can do on your own to prevent diseases and keep yourself healthy. What should I know about diet, weight, and exercise? Eat a healthy diet  Eat a diet that includes plenty of vegetables, fruits, low-fat dairy products, and lean protein. Do not eat a lot of foods that are high in solid fats, added sugars, or sodium. Maintain a healthy weight Body mass index (BMI) is used to identify weight problems. It estimates body fat based on height and weight. Your health care provider can help determine your BMI and help you achieve or maintain a healthy weight. Get regular exercise Get regular exercise. This is one of the most important things you can do for your health. Most adults should: Exercise for at least 150 minutes each week. The exercise should increase your heart rate and make you sweat (moderate-intensity exercise). Do strengthening exercises at least twice a week. This is in addition to the moderate-intensity exercise. Spend less time sitting. Even light physical activity can be beneficial. Watch cholesterol and blood lipids Have your blood tested for lipids and cholesterol at 58 years of age, then have this test every 5 years. Have your cholesterol levels checked more often if: Your lipid or cholesterol levels are high. You are older than 58 years of age. You are at high risk for heart disease. What should I know about cancer screening? Depending on your health history and family history, you may need to have cancer screening at various ages. This may include screening for: Breast cancer. Cervical cancer. Colorectal cancer. Skin cancer. Lung  cancer. What should I know about heart disease, diabetes, and high blood pressure? Blood pressure and heart disease High blood pressure causes heart disease and increases the risk of stroke. This is more likely to develop in people who have high blood pressure readings or are overweight. Have your blood pressure checked: Every 3-5 years if you are 41-61 years of age. Every year if you are 46 years old or older. Diabetes Have regular diabetes screenings. This checks your fasting blood sugar level. Have the screening done: Once every three years after age 29 if you are at a normal weight and have a low risk for diabetes. More often and at a younger age if you are overweight or have a high risk for diabetes. What should I know about preventing infection? Hepatitis B If you have a higher risk for hepatitis B, you should be screened for this virus. Talk with your health care provider to find out if you are at risk for hepatitis B infection. Hepatitis C Testing is recommended for: Everyone born from [birth year] through 1966. Anyone with known risk factors for hepatitis C. Sexually transmitted infections (STIs) Get screened for STIs, including gonorrhea and chlamydia, if: You are sexually active and are younger than 58 years of age. You are older than 58 years of age and your health care provider tells you that you are at risk for this type of infection. Your sexual activity has changed since you were last screened, and you are at increased risk for chlamydia or gonorrhea. Ask your health care provider if you are at risk. Ask  your health care provider about whether you are at high risk for HIV. Your health care provider may recommend a prescription medicine to help prevent HIV infection. If you choose to take medicine to prevent HIV, you should first get tested for HIV. You should then be tested every 3 months for as long as you are taking the medicine. Pregnancy If you are about to stop having your  period (premenopausal) and you may become pregnant, seek counseling before you get pregnant. Take 400 to 800 micrograms (mcg) of folic acid every day if you become pregnant. Ask for birth control (contraception) if you want to prevent pregnancy. Osteoporosis and menopause Osteoporosis is a disease in which the bones lose minerals and strength with aging. This can result in bone fractures. If you are 30 years old or older, or if you are at risk for osteoporosis and fractures, ask your health care provider if you should: Be screened for bone loss. Take a calcium or vitamin D supplement to lower your risk of fractures. Be given hormone replacement therapy (HRT) to treat symptoms of menopause. Follow these instructions at home: Alcohol use Do not drink alcohol if: Your health care provider tells you not to drink. You are pregnant, may be pregnant, or are planning to become pregnant. If you drink alcohol: Limit how much you have to: 0-1 drink a day. Know how much alcohol is in your drink. In the [place] one drink equals one 12 oz bottle of beer (355 mL), one 5 oz glass of wine (148 mL), or one 1 oz glass of hard liquor (44 mL). Lifestyle Do not use any products that contain nicotine or tobacco. These products include cigarettes, chewing tobacco, and vaping devices, such as e-cigarettes. If you need help quitting, ask your health care provider. Do not use street drugs. Do not share needles. Ask your health care provider for help if you need support or information about quitting drugs. General instructions Schedule regular health, dental, and eye exams. Stay current with your vaccines. Tell your health care provider if: You often feel depressed. You have ever been abused or do not feel safe at home. Summary Adopting a healthy lifestyle and getting preventive care are important in promoting health and wellness. Follow your health care provider's instructions about healthy diet, exercising, and  getting tested or screened for diseases. Follow your health care provider's instructions on monitoring your cholesterol and blood pressure. This information is not intended to replace advice given to you by your health care provider. Make sure you discuss any questions you have with your health care provider. Document Revised: 11/08/2020 Document Reviewed: 11/08/2020 Elsevier Patient Education  2024 [employer].

## 2022-12-25 NOTE — Progress Notes (Signed)
Complete physical exam  Patient: Darlene Webb   DOB: 12-27-1965   57 y.o. Female  MRN: [id number]  Subjective:    Chief Complaint  Patient presents with   Annual Exam    Darlene Webb is a 57 y.o. female who presents today for a complete physical exam. She reports consuming a general /non dairy diet. Home exercise routine includes walking twice a week for 2 hrs per week. She generally feels well. She reports sleeping well. She does not have additional problems to discuss today.    Most recent fall risk assessment:     No data to display           Most recent depression screenings:    12/25/2022    3:28 PM 09/09/2021   11:20 AM  PHQ 2/9 Scores  PHQ - 2 Score 0 1  PHQ- 9 Score  1    Vision:Within last year and Dental: No current dental problems and Receives regular dental care  Patient Active Problem List   Diagnosis Date Noted   History of iron deficiency anemia 12/25/2022   Colon polyps 10/17/2019      Patient Care Team: Karie Georges, MD as PCP - General (Family Medicine) Newman Pies, MD as Consulting Physician (Otolaryngology)   Outpatient Medications Prior to Visit  Medication Sig   CALCIUM PO Take by mouth.   Cyanocobalamin (VITAMIN B-12 PO) Take 1,000 mcg by mouth daily.   Ferrous Sulfate (IRON PO) Take 65 mg by mouth daily.   No facility-administered medications prior to visit.    Review of Systems  HENT:  Negative for hearing loss.   Eyes:  Negative for blurred vision.  Respiratory:  Negative for shortness of breath.   Cardiovascular:  Negative for chest pain.  Gastrointestinal: Negative.   Genitourinary: Negative.   Musculoskeletal:  Negative for back pain.  Neurological:  Negative for headaches.  Psychiatric/Behavioral:  Negative for depression.   All other systems reviewed and are negative.      Objective:     BP (!) 140/80 (BP Location: Right Arm, Patient Position: Sitting, Cuff Size: Normal)   Pulse 85   Temp 98.6  F (37 C) (Oral)   Ht 5\' 2"  (1.575 m)   Wt 123 lb 1.6 oz (55.8 kg)   LMP 11/19/2015   SpO2 99%   BMI 22.52 kg/m    Physical Exam Vitals reviewed.  Constitutional:      Appearance: Normal appearance. She is well-groomed and normal weight.  HENT:     Right Ear: Tympanic membrane and ear canal normal.     Left Ear: Tympanic membrane and ear canal normal.     Mouth/Throat:     Mouth: Mucous membranes are moist.     Pharynx: No posterior oropharyngeal erythema.  Eyes:     Conjunctiva/sclera: Conjunctivae normal.  Neck:     Thyroid: No thyromegaly.  Cardiovascular:     Rate and Rhythm: Normal rate and regular rhythm.     Pulses: Normal pulses.     Heart sounds: S1 normal and S2 normal.  Pulmonary:     Effort: Pulmonary effort is normal.     Breath sounds: Normal breath sounds and air entry.  Abdominal:     General: Bowel sounds are normal.  Musculoskeletal:     Right lower leg: No edema.     Left lower leg: No edema.  Lymphadenopathy:     Cervical: No cervical adenopathy.  Neurological:     Mental Status:  She is alert and oriented to person, place, and time. Mental status is at baseline.     Gait: Gait is intact.  Psychiatric:        Mood and Affect: Mood and affect normal.        Speech: Speech normal.        Behavior: Behavior normal.        Judgment: Judgment normal.      No results found for any visits on 12/25/22.     Assessment & Plan:    Routine Health Maintenance and Physical Exam  Immunization History  Administered Date(s) Administered   Influenza,inj,Quad PF,6+ Mos 06/27/2022   MODERNA COVID-19 SARS-COV-2 PEDS BIVALENT BOOSTER 6Y-11Y 10/02/2019, 11/01/2019   Tdap 03/01/2020   Zoster Recombinat (Shingrix) 03/01/2020, 05/03/2020    Health Maintenance  Topic Date Due   MAMMOGRAM  11/20/2021   COVID-19 Vaccine (3 - 2023-24 season) 01/10/2023 (Originally 03/03/2022)   Hepatitis C Screening  12/25/2023 (Originally 08/14/1982)   HIV Screening  12/25/2023  (Originally 08/15/1979)   INFLUENZA VACCINE  02/01/2023   PAP SMEAR-Modifier  03/02/2023   Colonoscopy  09/16/2026   DTaP/Tdap/Td (2 - Td or Tdap) 03/01/2030   Zoster Vaccines- Shingrix  Completed   HPV VACCINES  Aged Out    Discussed health benefits of physical activity, and encouraged her to engage in regular exercise appropriate for her age and condition.  Lipid screening -     Lipid panel; Future  Preventative health care -     Comprehensive metabolic panel -     CBC with Differential/Platelet  History of iron deficiency anemia -     Iron, TIBC and Ferritin Panel   Normal physical exam findings today, will order labs for annual surveillance and cardiac risk assessment. Patinet had questions about daily recommended vitamins for women. I wrote down the recommendations and gave them to the patient. Handouts given for healthy eating and exercise.   Return in about 1 year (around 12/25/2023) for annual physical exam.     Karie Georges, MD

## 2022-12-26 LAB — LIPID PANEL
Cholesterol: 218 mg/dL — ABNORMAL HIGH (ref 0–200)
HDL: 79.9 mg/dL (ref >39.00)
LDL Cholesterol: 126 mg/dL — ABNORMAL HIGH (ref 0–99)
NonHDL: 137.79
Total CHOL/HDL Ratio: 3
Triglycerides: 59 mg/dL (ref 0.0–149.0)
VLDL: 11.8 mg/dL (ref 0.0–40.0)

## 2022-12-26 LAB — CBC WITH DIFFERENTIAL/PLATELET
Basophils Absolute: 0.1 10*3/uL (ref 0.0–0.1)
Basophils Relative: 1 % (ref 0.0–3.0)
Eosinophils Absolute: 0.2 10*3/uL (ref 0.0–0.7)
Eosinophils Relative: 3.5 % (ref 0.0–5.0)
HCT: 39 % (ref 36.0–46.0)
Hemoglobin: 12.7 g/dL (ref 12.0–15.0)
Lymphocytes Relative: 44.7 % (ref 12.0–46.0)
Lymphs Abs: 2.7 10*3/uL (ref 0.7–4.0)
MCHC: 32.7 g/dL (ref 30.0–36.0)
MCV: 90.4 fl (ref 78.0–100.0)
Monocytes Absolute: 0.3 10*3/uL (ref 0.1–1.0)
Monocytes Relative: 5.7 % (ref 3.0–12.0)
Neutro Abs: 2.7 10*3/uL (ref 1.4–7.7)
Neutrophils Relative %: 45.1 % (ref 43.0–77.0)
Platelets: 360 10*3/uL (ref 150.0–400.0)
RBC: 4.31 Mil/uL (ref 3.87–5.11)
RDW: 12.9 % (ref 11.5–15.5)
WBC: 6.1 10*3/uL (ref 4.0–10.5)

## 2022-12-26 LAB — IBC + FERRITIN
Ferritin: 38.3 ng/mL (ref 10.0–291.0)
Iron: 110 ug/dL (ref 42–145)
Saturation Ratios: 32.7 % (ref 20.0–50.0)
TIBC: 336 ug/dL (ref 250.0–450.0)
Transferrin: 240 mg/dL (ref 212.0–360.0)

## 2022-12-26 LAB — COMPREHENSIVE METABOLIC PANEL
ALT: 18 U/L (ref 0–35)
AST: 20 U/L (ref 0–37)
Albumin: 4.4 g/dL (ref 3.5–5.2)
Alkaline Phosphatase: 67 U/L (ref 39–117)
BUN: 10 mg/dL (ref 6–23)
CO2: 28 mEq/L (ref 19–32)
Calcium: 9.8 mg/dL (ref 8.4–10.5)
Chloride: 102 mEq/L (ref 96–112)
Creatinine, Ser: 0.65 mg/dL (ref 0.40–1.20)
GFR: 97.09 mL/min (ref >60.00)
Glucose, Bld: 77 mg/dL (ref 70–99)
Potassium: 4.4 mEq/L (ref 3.5–5.1)
Sodium: 137 mEq/L (ref 135–145)
Total Bilirubin: 1.1 mg/dL (ref 0.2–1.2)
Total Protein: 7.7 g/dL (ref 6.0–8.3)

## 2022-12-27 NOTE — Addendum Note (Signed)
Addended by: Marian Sorrow D on: 12/27/2022 08:08 AM   Modules accepted: Orders

## 2023-01-08 LAB — COMPREHENSIVE METABOLIC PANEL
ALT: 16 U/L (ref 0–35)
AST: 18 U/L (ref 0–37)
Albumin: 4.3 g/dL (ref 3.5–5.2)
Alkaline Phosphatase: 59 U/L (ref 39–117)
BUN: 18 mg/dL (ref 6–23)
CO2: 25 mEq/L (ref 19–32)
Calcium: 9.6 mg/dL (ref 8.4–10.5)
Chloride: 99 mEq/L (ref 96–112)
Creatinine, Ser: 0.7 mg/dL (ref 0.40–1.20)
GFR: 95.35 mL/min (ref >60.00)
Glucose, Bld: 95 mg/dL (ref 70–99)
Potassium: 4.4 mEq/L (ref 3.5–5.1)
Sodium: 134 mEq/L — ABNORMAL LOW (ref 135–145)
Total Bilirubin: 1.3 mg/dL — ABNORMAL HIGH (ref 0.2–1.2)
Total Protein: 7.7 g/dL (ref 6.0–8.3)

## 2023-01-08 LAB — LIPID PANEL
Cholesterol: 213 mg/dL — ABNORMAL HIGH (ref 0–200)
HDL: 85.2 mg/dL (ref >39.00)
LDL Cholesterol: 118 mg/dL — ABNORMAL HIGH (ref 0–99)
NonHDL: 128.23
Total CHOL/HDL Ratio: 3
Triglycerides: 52 mg/dL (ref 0.0–149.0)
VLDL: 10.4 mg/dL (ref 0.0–40.0)

## 2023-01-08 LAB — IBC + FERRITIN
Ferritin: 42.2 ng/mL (ref 10.0–291.0)
Iron: 110 ug/dL (ref 42–145)
Saturation Ratios: 33.4 % (ref 20.0–50.0)
TIBC: 329 ug/dL (ref 250.0–450.0)
Transferrin: 235 mg/dL (ref 212.0–360.0)

## 2023-01-08 LAB — CBC WITH DIFFERENTIAL/PLATELET
Basophils Absolute: 0 10*3/uL (ref 0.0–0.1)
Basophils Relative: 0.9 % (ref 0.0–3.0)
Eosinophils Absolute: 0.2 10*3/uL (ref 0.0–0.7)
Eosinophils Relative: 3.2 % (ref 0.0–5.0)
HCT: 38.8 % (ref 36.0–46.0)
Hemoglobin: 12.7 g/dL (ref 12.0–15.0)
Lymphocytes Relative: 38 % (ref 12.0–46.0)
Lymphs Abs: 2 10*3/uL (ref 0.7–4.0)
MCHC: 32.7 g/dL (ref 30.0–36.0)
MCV: 90.6 fl (ref 78.0–100.0)
Monocytes Absolute: 0.4 10*3/uL (ref 0.1–1.0)
Monocytes Relative: 6.9 % (ref 3.0–12.0)
Neutro Abs: 2.7 10*3/uL (ref 1.4–7.7)
Neutrophils Relative %: 51 % (ref 43.0–77.0)
Platelets: 326 10*3/uL (ref 150.0–400.0)
RBC: 4.29 Mil/uL (ref 3.87–5.11)
RDW: 13.3 % (ref 11.5–15.5)
WBC: 5.3 10*3/uL (ref 4.0–10.5)

## 2024-01-14 ENCOUNTER — Encounter: Payer: Self-pay | Admitting: Family Medicine

## 2024-01-14 ENCOUNTER — Ambulatory Visit (INDEPENDENT_AMBULATORY_CARE_PROVIDER_SITE_OTHER): Payer: Self-pay | Admitting: Family Medicine

## 2024-01-14 VITALS — BP 130/80 | HR 82 | Temp 98.6°F | Ht 62.25 in | Wt 123.6 lb

## 2024-01-14 DIAGNOSIS — Z1231 Encounter for screening mammogram for malignant neoplasm of breast: Secondary | ICD-10-CM

## 2024-01-14 DIAGNOSIS — Z13220 Encounter for screening for lipoid disorders: Secondary | ICD-10-CM

## 2024-01-14 DIAGNOSIS — Z0000 Encounter for general adult medical examination without abnormal findings: Secondary | ICD-10-CM

## 2024-01-14 NOTE — Patient Instructions (Signed)

## 2024-01-14 NOTE — Progress Notes (Signed)
 Complete physical exam  Patient: Darlene Webb   DOB: 1966-02-24   58 y.o. Female  MRN: [id number]  Subjective:    Chief Complaint  Patient presents with   Annual Exam    Darlene Webb is a 58 y.o. female who presents today for a complete physical exam. She reports consuming a general diet, states she  sometimes feels like she needs to eat more iron, gets good sources of protein, stays away from fast food. Home exercise routine includes walking 1-2  hrs per week. Yoga once a week. She generally feels well. She reports sleeping ok, occasional difficulty with staying asleep. She does not have additional problems to discuss today.   Pt states she had traveled internationally about 2 weeks ago and when she got home she had URI symptoms, states that her throat was very sore and she thought it might have been covid, states she is nearly resolved except for a nagging cough, non productive.  Most recent fall risk assessment:     No data to display           Most recent depression screenings:    01/14/2024    2:28 PM 12/25/2022    3:28 PM  PHQ 2/9 Scores  PHQ - 2 Score 0 0  PHQ- 9 Score 0     Vision:Within last year and Dental: No current dental problems and Receives regular dental care  Patient Active Problem List   Diagnosis Date Noted   History of iron deficiency anemia 12/25/2022   Colon polyps 10/17/2019      Patient Care Team: Ozell Heron HERO, MD as PCP - General (Family Medicine) Karis Clunes, MD as Consulting Physician (Otolaryngology)   Outpatient Medications Prior to Visit  Medication Sig   Ferrous Sulfate (IRON PO) Take 65 mg by mouth daily.   [DISCONTINUED] CALCIUM PO Take by mouth.   [DISCONTINUED] Cyanocobalamin (VITAMIN B-12 PO) Take 1,000 mcg by mouth daily.   No facility-administered medications prior to visit.    Review of Systems  HENT:  Negative for hearing loss.   Eyes:  Negative for blurred vision.  Respiratory:  Negative for  shortness of breath.   Cardiovascular:  Negative for chest pain.  Gastrointestinal: Negative.   Genitourinary: Negative.   Musculoskeletal:  Negative for back pain.  Neurological:  Negative for headaches.  Psychiatric/Behavioral:  Negative for depression.        Objective:     BP 130/80   Pulse 82   Temp 98.6 F (37 C) (Oral)   Ht 5' 2.25 (1.581 m)   Wt 123 lb 9.6 oz (56.1 kg)   LMP 11/19/2015   SpO2 100%   BMI 22.43 kg/m    Physical Exam Vitals reviewed.  Constitutional:      Appearance: Normal appearance. She is well-groomed and normal weight.  HENT:     Right Ear: Tympanic membrane and ear canal normal.     Left Ear: Tympanic membrane and ear canal normal.     Mouth/Throat:     Mouth: Mucous membranes are moist.     Pharynx: No posterior oropharyngeal erythema.  Eyes:     Conjunctiva/sclera: Conjunctivae normal.  Neck:     Thyroid: No thyromegaly.  Cardiovascular:     Rate and Rhythm: Normal rate and regular rhythm.     Pulses: Normal pulses.     Heart sounds: S1 normal and S2 normal.  Pulmonary:     Effort: Pulmonary effort is normal.  Breath sounds: Normal breath sounds and air entry.  Abdominal:     General: Abdomen is flat. Bowel sounds are normal.     Palpations: Abdomen is soft.  Musculoskeletal:     Right lower leg: No edema.     Left lower leg: No edema.  Lymphadenopathy:     Cervical: No cervical adenopathy.  Neurological:     Mental Status: She is alert and oriented to person, place, and time. Mental status is at baseline.     Gait: Gait is intact.  Psychiatric:        Mood and Affect: Mood and affect normal.        Speech: Speech normal.        Behavior: Behavior normal.        Judgment: Judgment normal.      No results found for any visits on 01/14/24.     Assessment & Plan:    Routine Health Maintenance and Physical Exam  Immunization History  Administered Date(s) Administered   Influenza,inj,Quad PF,6+ Mos 06/27/2022    MODERNA COVID-19 SARS-COV-2 PEDS BIVALENT BOOSTER 58yr-24yr 10/02/2019, 11/01/2019   Tdap 03/01/2020   Zoster Recombinant(Shingrix) 03/01/2020, 05/03/2020    Health Maintenance  Topic Date Due   Hepatitis B Vaccines (1 of 3 - 19+ 3-dose series) Never done   COVID-19 Vaccine (3 - Moderna risk series) 11/29/2019   MAMMOGRAM  11/20/2021   Hepatitis C Screening  01/13/2025 (Originally 08/14/1982)   HIV Screening  01/13/2025 (Originally 08/15/1979)   INFLUENZA VACCINE  02/01/2024   Cervical Cancer Screening (HPV/Pap Cotest)  03/01/2025   Colonoscopy  09/16/2026   DTaP/Tdap/Td (2 - Td or Tdap) 03/01/2030   Zoster Vaccines- Shingrix  Completed   HPV VACCINES  Aged Out   Meningococcal B Vaccine  Aged Out    Discussed health benefits of physical activity, and encouraged her to engage in regular exercise appropriate for her age and condition.  Breast cancer screening by mammogram -     3D Screening Mammogram, Left and Right; Future  Lipid screening -     Lipid panel; Future  Routine general medical examination at a health care facility -     CBC with Differential/Platelet; Future -     Comprehensive metabolic panel with GFR; Future   Normal physical exam findings. I counseled the patient on the recommended amount of exercise per CDC recommendation and advised her to increase the intensity of her exercise. I reviewed preventative screening, immunizations, and medical history and updated in the chart, and appropriate labs and vaccinations were ordered. Handouts given on healthy eating and exercise.    Return in 1 year (on 01/13/2025) for annual physical exam.     Heron CHRISTELLA Sharper, MD

## 2024-01-15 LAB — CBC WITH DIFFERENTIAL/PLATELET
Basophils Absolute: 0 K/uL (ref 0.0–0.1)
Basophils Relative: 0.6 % (ref 0.0–3.0)
Eosinophils Absolute: 0.3 K/uL (ref 0.0–0.7)
Eosinophils Relative: 4.9 % (ref 0.0–5.0)
HCT: 37.5 % (ref 36.0–46.0)
Hemoglobin: 12.6 g/dL (ref 12.0–15.0)
Lymphocytes Relative: 41 % (ref 12.0–46.0)
Lymphs Abs: 2.3 K/uL (ref 0.7–4.0)
MCHC: 33.5 g/dL (ref 30.0–36.0)
MCV: 90.8 fl (ref 78.0–100.0)
Monocytes Absolute: 0.4 K/uL (ref 0.1–1.0)
Monocytes Relative: 7 % (ref 3.0–12.0)
Neutro Abs: 2.6 K/uL (ref 1.4–7.7)
Neutrophils Relative %: 46.5 % (ref 43.0–77.0)
Platelets: 406 K/uL — ABNORMAL HIGH (ref 150.0–400.0)
RBC: 4.13 Mil/uL (ref 3.87–5.11)
RDW: 12.8 % (ref 11.5–15.5)
WBC: 5.7 K/uL (ref 4.0–10.5)

## 2024-01-15 LAB — LIPID PANEL
Cholesterol: 200 mg/dL (ref 0–200)
HDL: 73.7 mg/dL (ref >39.00)
LDL Cholesterol: 113 mg/dL — ABNORMAL HIGH (ref 0–99)
NonHDL: 126.36
Total CHOL/HDL Ratio: 3
Triglycerides: 65 mg/dL (ref 0.0–149.0)
VLDL: 13 mg/dL (ref 0.0–40.0)

## 2024-01-15 LAB — COMPREHENSIVE METABOLIC PANEL WITH GFR
ALT: 13 U/L (ref 0–35)
AST: 15 U/L (ref 0–37)
Albumin: 4.4 g/dL (ref 3.5–5.2)
Alkaline Phosphatase: 48 U/L (ref 39–117)
BUN: 16 mg/dL (ref 6–23)
CO2: 29 meq/L (ref 19–32)
Calcium: 9.3 mg/dL (ref 8.4–10.5)
Chloride: 104 meq/L (ref 96–112)
Creatinine, Ser: 0.66 mg/dL (ref 0.40–1.20)
GFR: 96.02 mL/min (ref >60.00)
Glucose, Bld: 72 mg/dL (ref 70–99)
Potassium: 5.1 meq/L (ref 3.5–5.1)
Sodium: 139 meq/L (ref 135–145)
Total Bilirubin: 0.8 mg/dL (ref 0.2–1.2)
Total Protein: 7.4 g/dL (ref 6.0–8.3)

## 2024-01-18 ENCOUNTER — Ambulatory Visit: Payer: Self-pay | Admitting: Family Medicine

## 2024-03-28 ENCOUNTER — Encounter: Payer: Self-pay | Admitting: Family Medicine
